# Patient Record
Sex: Female | Born: 1950 | Race: Black or African American | Hispanic: No | Smoking: Current every day smoker
Health system: Southern US, Community
[De-identification: ages and names within clinical notes are randomized; demographics above are authoritative.]

## PROBLEM LIST (undated history)

## (undated) DIAGNOSIS — O2230 Deep phlebothrombosis in pregnancy, unspecified trimester: Secondary | ICD-10-CM

## (undated) DIAGNOSIS — I10 Essential (primary) hypertension: Secondary | ICD-10-CM

## (undated) DIAGNOSIS — C419 Malignant neoplasm of bone and articular cartilage, unspecified: Secondary | ICD-10-CM

## (undated) DIAGNOSIS — E119 Type 2 diabetes mellitus without complications: Secondary | ICD-10-CM

## (undated) DIAGNOSIS — C801 Malignant (primary) neoplasm, unspecified: Secondary | ICD-10-CM

## (undated) DIAGNOSIS — E1140 Type 2 diabetes mellitus with diabetic neuropathy, unspecified: Secondary | ICD-10-CM

## (undated) DIAGNOSIS — I639 Cerebral infarction, unspecified: Secondary | ICD-10-CM

## (undated) HISTORY — PX: ABDOMINAL HYSTERECTOMY: SHX81

---

## 2004-03-21 ENCOUNTER — Other Ambulatory Visit: Payer: Self-pay

## 2004-11-22 ENCOUNTER — Emergency Department: Payer: Self-pay | Admitting: Emergency Medicine

## 2004-11-24 ENCOUNTER — Emergency Department: Payer: Self-pay | Admitting: Emergency Medicine

## 2005-03-24 ENCOUNTER — Ambulatory Visit: Payer: Self-pay

## 2006-04-13 ENCOUNTER — Emergency Department: Payer: Self-pay | Admitting: Emergency Medicine

## 2008-04-13 ENCOUNTER — Emergency Department: Payer: Self-pay | Admitting: Unknown Physician Specialty

## 2011-02-22 ENCOUNTER — Emergency Department: Payer: Self-pay | Admitting: Unknown Physician Specialty

## 2011-03-16 ENCOUNTER — Emergency Department: Payer: Self-pay | Admitting: Emergency Medicine

## 2011-05-10 ENCOUNTER — Emergency Department: Payer: Self-pay | Admitting: Unknown Physician Specialty

## 2011-06-09 ENCOUNTER — Emergency Department: Payer: Self-pay | Admitting: Internal Medicine

## 2011-09-29 ENCOUNTER — Emergency Department: Payer: Self-pay | Admitting: Emergency Medicine

## 2011-10-27 ENCOUNTER — Emergency Department: Payer: Self-pay | Admitting: Unknown Physician Specialty

## 2012-07-24 ENCOUNTER — Emergency Department: Payer: Self-pay | Admitting: Emergency Medicine

## 2012-07-24 LAB — URINALYSIS, COMPLETE
Bilirubin,UR: NEGATIVE
Glucose,UR: >=500 mg/dL (ref 0–75)
Ketone: NEGATIVE
Protein: NEGATIVE
RBC,UR: <1 /HPF (ref 0–5)
Squamous Epithelial: 4
WBC UR: 3 /HPF (ref 0–5)

## 2012-07-24 LAB — HEPATIC FUNCTION PANEL A (ARMC): Bilirubin, Direct: <0.1 mg/dL (ref 0.00–0.20)

## 2012-07-24 LAB — BASIC METABOLIC PANEL
Anion Gap: 7 (ref 7–16)
BUN: 17 mg/dL (ref 7–18)
Calcium, Total: 8.7 mg/dL (ref 8.5–10.1)
Chloride: 99 mmol/L (ref 98–107)
Co2: 27 mmol/L (ref 21–32)
Osmolality: 284 (ref 275–301)
Potassium: 4 mmol/L (ref 3.5–5.1)

## 2012-07-24 LAB — CBC
HCT: 31.1 % — ABNORMAL LOW (ref 35.0–47.0)
HGB: 10.3 g/dL — ABNORMAL LOW (ref 12.0–16.0)
Platelet: 177 10*3/uL (ref 150–440)
WBC: 5.6 10*3/uL (ref 3.6–11.0)

## 2012-07-24 LAB — LIPASE, BLOOD: Lipase: 126 U/L (ref 73–393)

## 2013-01-25 ENCOUNTER — Inpatient Hospital Stay: Payer: Self-pay | Admitting: Internal Medicine

## 2013-01-25 LAB — CBC WITH DIFFERENTIAL/PLATELET
Basophil #: 0.1 10*3/uL (ref 0.0–0.1)
Eosinophil #: 0 10*3/uL (ref 0.0–0.7)
HGB: 10.1 g/dL — ABNORMAL LOW (ref 12.0–16.0)
Lymphocyte #: 2.3 10*3/uL (ref 1.0–3.6)
Lymphocyte %: 31.3 %
MCH: 28.7 pg (ref 26.0–34.0)
MCHC: 32.9 g/dL (ref 32.0–36.0)
Monocyte #: 0.4 x10 3/mm (ref 0.2–0.9)
Monocyte %: 5.3 %
Neutrophil #: 4.6 10*3/uL (ref 1.4–6.5)
Neutrophil %: 62.4 %
Platelet: 149 10*3/uL — ABNORMAL LOW (ref 150–440)
RBC: 3.53 10*6/uL — ABNORMAL LOW (ref 3.80–5.20)

## 2013-01-25 LAB — URINALYSIS, COMPLETE
Bilirubin,UR: NEGATIVE
Blood: NEGATIVE
Glucose,UR: >=500 mg/dL (ref 0–75)
Hyaline Cast: 58
Leukocyte Esterase: NEGATIVE
Nitrite: NEGATIVE
Ph: 5 (ref 4.5–8.0)
Protein: 30
RBC,UR: 3 /HPF (ref 0–5)

## 2013-01-25 LAB — COMPREHENSIVE METABOLIC PANEL
Alkaline Phosphatase: 118 U/L (ref 50–136)
Anion Gap: 10 (ref 7–16)
Calcium, Total: 9.3 mg/dL (ref 8.5–10.1)
EGFR (African American): 20 — ABNORMAL LOW
Glucose: 502 mg/dL (ref 65–99)
Osmolality: 287 (ref 275–301)
Potassium: 4.1 mmol/L (ref 3.5–5.1)
SGOT(AST): 28 U/L (ref 15–37)
SGPT (ALT): 22 U/L (ref 12–78)
Total Protein: 7.7 g/dL (ref 6.4–8.2)

## 2013-01-25 LAB — TSH: Thyroid Stimulating Horm: 0.336 u[IU]/mL — ABNORMAL LOW

## 2013-01-25 LAB — HEMOGLOBIN A1C: Hemoglobin A1C: 11.1 % — ABNORMAL HIGH (ref 4.2–6.3)

## 2013-01-25 LAB — T4, FREE: Free Thyroxine: 1.09 ng/dL (ref 0.76–1.46)

## 2013-01-26 LAB — CBC WITH DIFFERENTIAL/PLATELET
Basophil #: 0 10*3/uL (ref 0.0–0.1)
Basophil %: 0.2 %
Eosinophil #: 0 10*3/uL (ref 0.0–0.7)
Eosinophil %: 0.4 %
HCT: 27.1 % — ABNORMAL LOW (ref 35.0–47.0)
HGB: 9 g/dL — ABNORMAL LOW (ref 12.0–16.0)
MCH: 28.8 pg (ref 26.0–34.0)
Monocyte #: 0.3 x10 3/mm (ref 0.2–0.9)
Monocyte %: 5.6 %
Neutrophil #: 3.2 10*3/uL (ref 1.4–6.5)
Neutrophil %: 50.4 %
RBC: 3.13 10*6/uL — ABNORMAL LOW (ref 3.80–5.20)

## 2013-01-26 LAB — MAGNESIUM: Magnesium: 1.9 mg/dL

## 2013-01-26 LAB — BASIC METABOLIC PANEL
BUN: 26 mg/dL — ABNORMAL HIGH (ref 7–18)
Calcium, Total: 8.5 mg/dL (ref 8.5–10.1)
Creatinine: 1.63 mg/dL — ABNORMAL HIGH (ref 0.60–1.30)
Glucose: 100 mg/dL — ABNORMAL HIGH (ref 65–99)
Osmolality: 284 (ref 275–301)

## 2013-01-26 LAB — LIPID PANEL
HDL Cholesterol: 29 mg/dL — ABNORMAL LOW (ref 40–60)
Ldl Cholesterol, Calc: 51 mg/dL (ref 0–100)

## 2013-01-26 LAB — URINE CULTURE

## 2013-07-16 ENCOUNTER — Emergency Department: Payer: Self-pay | Admitting: Emergency Medicine

## 2013-08-27 ENCOUNTER — Emergency Department: Payer: Self-pay | Admitting: Emergency Medicine

## 2013-08-27 LAB — CBC WITH DIFFERENTIAL/PLATELET
Basophil #: 0 10*3/uL (ref 0.0–0.1)
HGB: 10.3 g/dL — ABNORMAL LOW (ref 12.0–16.0)
Lymphocyte #: 2.1 10*3/uL (ref 1.0–3.6)
Lymphocyte %: 20.5 %
MCH: 29.6 pg (ref 26.0–34.0)
MCHC: 33 g/dL (ref 32.0–36.0)
MCV: 90 fL (ref 80–100)
Monocyte #: 0.7 x10 3/mm (ref 0.2–0.9)
Monocyte %: 6.5 %
Neutrophil #: 7.6 10*3/uL — ABNORMAL HIGH (ref 1.4–6.5)
Neutrophil %: 72.3 %
Platelet: 290 10*3/uL (ref 150–440)
RBC: 3.47 10*6/uL — ABNORMAL LOW (ref 3.80–5.20)
RDW: 12.1 % (ref 11.5–14.5)
WBC: 10.4 10*3/uL (ref 3.6–11.0)

## 2013-08-27 LAB — COMPREHENSIVE METABOLIC PANEL
Albumin: 3.4 g/dL (ref 3.4–5.0)
Anion Gap: 6 — ABNORMAL LOW (ref 7–16)
BUN: 15 mg/dL (ref 7–18)
Bilirubin,Total: 0.3 mg/dL (ref 0.2–1.0)
Co2: 28 mmol/L (ref 21–32)
Creatinine: 1.22 mg/dL (ref 0.60–1.30)
EGFR (African American): 55 — ABNORMAL LOW
Glucose: 357 mg/dL — ABNORMAL HIGH (ref 65–99)
Osmolality: 278 (ref 275–301)
Potassium: 4.2 mmol/L (ref 3.5–5.1)
Sodium: 131 mmol/L — ABNORMAL LOW (ref 136–145)
Total Protein: 8.5 g/dL — ABNORMAL HIGH (ref 6.4–8.2)

## 2014-08-01 ENCOUNTER — Emergency Department: Payer: Self-pay | Admitting: Emergency Medicine

## 2014-08-01 LAB — CBC WITH DIFFERENTIAL/PLATELET
Basophil #: 0 10*3/uL (ref 0.0–0.1)
Basophil %: 0.5 %
EOS PCT: 0.5 %
Eosinophil #: 0 10*3/uL (ref 0.0–0.7)
HCT: 32.7 % — AB (ref 35.0–47.0)
HGB: 10.5 g/dL — AB (ref 12.0–16.0)
Lymphocyte #: 1.8 10*3/uL (ref 1.0–3.6)
Lymphocyte %: 35.8 %
MCH: 29.2 pg (ref 26.0–34.0)
MCHC: 32.1 g/dL (ref 32.0–36.0)
MCV: 91 fL (ref 80–100)
Monocyte #: 0.3 x10 3/mm (ref 0.2–0.9)
Monocyte %: 5 %
Neutrophil #: 2.9 10*3/uL (ref 1.4–6.5)
Neutrophil %: 58.2 %
Platelet: 146 10*3/uL — ABNORMAL LOW (ref 150–440)
RBC: 3.59 10*6/uL — AB (ref 3.80–5.20)
RDW: 12 % (ref 11.5–14.5)
WBC: 5 10*3/uL (ref 3.6–11.0)

## 2014-08-01 LAB — BASIC METABOLIC PANEL
ANION GAP: 7 (ref 7–16)
BUN: 30 mg/dL — AB (ref 7–18)
Calcium, Total: 8.9 mg/dL (ref 8.5–10.1)
Chloride: 97 mmol/L — ABNORMAL LOW (ref 98–107)
Co2: 29 mmol/L (ref 21–32)
Creatinine: 1.65 mg/dL — ABNORMAL HIGH (ref 0.60–1.30)
EGFR (African American): 40 — ABNORMAL LOW
EGFR (Non-African Amer.): 33 — ABNORMAL LOW
GLUCOSE: 356 mg/dL — AB (ref 65–99)
Osmolality: 287 (ref 275–301)
Potassium: 3.9 mmol/L (ref 3.5–5.1)
SODIUM: 133 mmol/L — AB (ref 136–145)

## 2014-08-01 LAB — TROPONIN I: Troponin-I: <0.02 ng/mL

## 2014-12-25 NOTE — Discharge Summary (Signed)
PATIENT NAME:  Tasha Howard, Tasha Howard MR#:  [id number] DATE OF BIRTH:  08/27/1951  DATE OF ADMISSION:  01/25/2013 DATE OF DISCHARGE:  01/26/2013  FINAL DIAGNOSES:  1.  Uncontrolled diabetes.  2.  Hypoglycemia.  3.  Urinary tract infection.  4.  Acute renal failure.  5.  Hypokalemia.  6.  Gastritis.  7.  Diabetic neuropathy.   CONDITION ON DISCHARGE: Stable.   CODE STATUS: FULL CODE.   DISCHARGE MEDICATIONS: 1.  Amitriptyline 10 mg oral tablet once a day.  2.  Lisinopril 10 mg oral tablet once a day.  3.  Percocet 7.5/325 mg oral tablet 2 times a day as needed for pain.  4.  Gabapentin 300 mg oral capsule, 600 mg once a day and 900 mg at bedtime. 5.  Pravastatin 10 mg oral tablet once a day.  6.  Insulin glargine 24 units subcutaneous once a day.  7.  Ceftin 500 mg oral tablet 2 times a day for 4 days.   HOME OXYGEN: No.   DIET ON DISCHARGE: Low sodium, carbohydrate-controlled ADA diet.   ACTIVITY LIMITATION: As tolerated.   TIMEFRAME TO FOLLOW-UP:  Within 1 to 2 weeks. Advised to keep kidney function checked with PMD in the next 64 to 2 weeks.   HISTORY OF PRESENT ILLNESS: A 64 year old African American female with hypertension, diabetes, diabetic neuropathy presented with feeling unwell for about a week. Generalized weakness, loss of appetite, nausea and vomiting for the past 3 days. She stated that she did not have any food and she was still taking medications.  She took Lantus the previous night, felt like she could not keep her food down. She denied any fever, but had about 10 pound weight loss in the last 1 week. While in the ER, she was noted to have renal failure. Creatinine was 2.82, elevated blood glucose of 500 range and hemoglobin A1c was 11.11. So she was admitted for further management of hypoglycemia   Zeba:  1.  The patient came with nausea, abdominal pain, vomiting and renal failure, dehydration and hypoglycemia. She was given dose of Lantus and  with IV fluids and gluco checks.  Her blood sugar level came down and remained under control the next day.   2.  She was found having UTI and was started on Rocephin.  Culture returned contamination, but we discharged her with antibiotics.  3.  Nausea and vomiting.  Likely is presentation of UTI or viral gastroenteritis, but resolved with better control of glucose and antibiotic for just 1 day. The patient tolerated diet very well.  4.  Acute renal failure due to dehydration and hypoglycemia, which was resolving having significant and appropriate response to IV fluid the next day and so we advised her to go home and follow renal function with her primary care physician to make sure complete resolution.  5.  Hypokalemia. We replaced orally.  6.  Diabetic neuropathy. Continued Gabapentin. 7.  Smoking cessation counseling was done for 5 minutes.   IMPORTANT LABORATORY AND DIAGNOSTIC RESULTS:  WBC count 7.4, hemoglobin 10.1, platelet count 149, creatinine 2.82, sodium 128, potassium 4.1, TSH 0.336,  Urinalysis is positive with 28 WBCs. Urine culture: Mixed organisms. Kidney sonogram:  No hydronephrosis. Creatinine level came down to 1.63 the next day.  Blood glucose was 100. Triglyceride was 168, total cholesterol was 114 and HDL was 29, LDL was 61.   TOTAL TIME SPENT ON THIS DISCHARGE: 45 minutes.  ____________________________ Ceasar Lund Anselm Jungling, MD vgv:sb D:  01/29/2013 22:59:09 ET T: 01/30/2013 10:17:02 ET JOB#: [id number]  cc: Ceasar Lund. Anselm Jungling, MD, <Dictator> Vaughan Basta MD ELECTRONICALLY SIGNED 01/31/2013 14:02

## 2014-12-25 NOTE — H&P (Signed)
PATIENT NAME:  Tasha Howard, Tasha Howard MR#:  [id number] DATE OF BIRTH:  1950/11/15  DATE OF ADMISSION:  01/25/2013  REFERRING PHYSICIAN:  Dr. Corky Downs   PRIMARY CARE PHYSICIAN:  [facility]   CHIEF COMPLAINT:  Not eating well, lethargic, weak.    HISTORY OF PRESENT ILLNESS: The patient is a pleasant, 64 year old African-American female with hypertension, diabetes, diabetic neuropathy, who has been feeling unwell for about a week, with generalized weakness, loss of appetite, nausea and vomiting for the past 3 days once daily. She also states that she had no food at home, although she is taking her medications. She took Lantus last night. She feels like she could not keep her food down yesterday, but feels like she could perhaps take it today. She has been taking her ACE inhibitor for her hypertension in the meantime. She denies having any fevers, but states she has had about a 10-pounds weight loss in the last week. While in the ER, she was noted to have acute renal failure, with creatinine of 2.82, elevation of blood glucose in the 500 range, and elevation of hemoglobin A1c of 11.1. Hospitalist services were contacted for further evaluation and management.   PAST MEDICAL HISTORY:  Diabetes, hypertension, depression, diabetic neuropathy.   PAST SURGICAL HISTORY:  Hysterectomy and goiter removal.   FAMILY HISTORY:  Mom with diabetes, hypertension and cancer.  ALLERGIES:  CODEINE, causing itching.   SOCIAL HISTORY:  Smokes 1/2 pack a day for about 23 years. Occasional alcohol. No drug use. Is on disability.   OUTPATIENT MEDICATIONS:  Gabapentin 600 mg at night, per patient, Lantus 24 units at bedtime, lisinopril 10 mg daily, amitriptyline 25 mg at bedtime, Cymbalta possibly 10 mg at bedtime, but none of these are confirmed at this point.    REVIEW OF SYSTEMS:  CONSTITUTIONAL:  No fever. Positive fatigue, weakness, and 10-pound weight loss in the past week, per patient.  EYES:  Had some blurry  vision earlier today. No double vision. No redness.  EARS, NOSE, THROAT:  No tinnitus or hearing loss. Some sinus pain on the left prior, not now.  CARDIOVASCULAR:  No chest pains or palpitations. No swelling in the legs or arrhythmia.  RESPIRATORY: Had a dry cough, without wheezing, hemoptysis, shortness of breath or dyspnea on exertion.  GENITOURINARY: Denies dysuria, hematuria, incontinence, or decrease or increase in frequency.  GASTROINTESTINAL: Positive for nausea and vomiting as above. No abdominal pain. No hematemesis, dark stools or ulcers.  ENDOCRINE:  Denies polyuria, nocturia. HEMATOLOGIC/LYMPHATIC:  Denies anemia or easy bruising.  SKIN:  No rashes.  MUSCULOSKELETAL:  Denies arthritis.  NEUROLOGIC:  Denies focal weakness, numbness. Has diabetic neuropathy.  PSYCHIATRIC:  Has depression.   PHYSICAL EXAMINATION: VITAL SIGNS: Temperature on arrival was 99.2, pulse rate 109, respiratory rate 20, blood pressure 101/60, O2 sat 96% on room air.  GENERAL: The patient is a well-developed, African-American female lying in bed in no obvious distress.  HEENT: Normocephalic, atraumatic. Pupils are equal and reactive. Anicteric sclerae. Extraocular muscles intact. Dry mucous membranes.  NECK:  Supple. No thyroid tenderness. No cervical lymphadenopathy.  CARDIOVASCULAR:  S1, S2, tachycardic. No murmurs, rubs or gallops.  LUNGS:  Clear to auscultation, without wheezing, rhonchi or rales.  ABDOMEN:  Soft. Mild tenderness to palpation in the left lower quadrant, without rebound or guarding. Normoactive bowel sounds in all quadrants.  EXTREMITIES:  No significant lower extremity edema.  NEUROLOGIC:  Cranial nerves II to XII grossly intact. Strength is 5/5 in all extremities. Sensation  intact to light touch.  PSYCHIATRIC:  Awake, alert, oriented x 3. Cooperative, pleasant, conversant.  SKIN:  No obvious rashes.   LABS:  Glucose on arrival 502, BUN 33, creatinine 2.82,  Sodium 128, chloride 92.  Hemoglobin A1c 11.1. LFTs within normal limits. TSH is 0.33 and free thyroxine is 1.09. WBCs 7.9, hemoglobin 10.1, platelets 149. UA:  No nitrites or leukocyte esterase, 3 RBCs, 28 WBCs, 1+ bacteria. EKG:  Normal sinus rhythm, rate is 95, no acute ST elevations or depressions.   ASSESSMENT AND PLAN: We have a pleasant, 64 year-old female with hypertension, diabetes, diabetic neuropathy, who has been feeling unwell, with possible gastritis, with symptoms ongoing for the past week, with decreased p.o. intake. 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 This is likely secondary to dehydration, poor p.o. intake, and taking lisinopril. Would hold the lisinopril, start the patient on aggressive IV fluid resuscitation, and check a renal ultrasound to look for hydronephrosis, Although patient has a positive UA, she is not having any dysuria or symptomatic signs at this point. However, given the renal failure, urine cultures, I have ordered and will give her one-time ceftriaxone. I have also ordered a urine creatinine and urine sodium to calculate fractional excretion of sodium to look for volume state, but I think she is on the dehydrated side. If the creatinine gets worse, would consider a Nephrology consult. I would continue the Lantus, start her on sliding scale insulin. Her blood pressure is acceptable at this point, although on the lower side, and I would hold the ACE inhibitor. The patient does have hyponatremia, but I think that is in the setting of hyperglycemia, and corrected for that it will be in the low 130s, which is not too far from her last sodium. Her hemoglobin A1c is 11, dictating poor compliance as an outpatient. Although TSH is on the lower side, I would rather do full thyroid function test in 4 to 6 weeks, once her acute illness is resolved. Will start on heparin for deep vein thrombosis prophylaxis. The patient is FULL CODE. For  tobacco abuse, she was counseled for 3 minutes. I want to start her on a patch. I do not think she is wanting to quit at this point. This could be addressed later as well.   Total time spent is 65 minutes.     ____________________________ Vivien Presto, MD sa:mr D: 01/25/2013 18:49:11 ET T: 01/25/2013 20:20:29 ET JOB#: [id number]  cc: Vivien Presto, MD, <Dictator> Vivien Presto MD ELECTRONICALLY SIGNED 02/04/2013 20:03

## 2018-10-19 ENCOUNTER — Encounter: Payer: Self-pay | Admitting: Emergency Medicine

## 2018-10-19 ENCOUNTER — Emergency Department: Payer: Medicare HMO

## 2018-10-19 ENCOUNTER — Other Ambulatory Visit: Payer: Self-pay

## 2018-10-19 ENCOUNTER — Emergency Department
Admission: EM | Admit: 2018-10-19 | Discharge: 2018-10-19 | Disposition: A | Discharge status: Other Acute Inpatient Hospital | Payer: Medicare HMO | Attending: Emergency Medicine | Admitting: Emergency Medicine

## 2018-10-19 DIAGNOSIS — I639 Cerebral infarction, unspecified: Secondary | ICD-10-CM | POA: Insufficient documentation

## 2018-10-19 DIAGNOSIS — I10 Essential (primary) hypertension: Secondary | ICD-10-CM | POA: Diagnosis not present

## 2018-10-19 DIAGNOSIS — I629 Nontraumatic intracranial hemorrhage, unspecified: Secondary | ICD-10-CM | POA: Diagnosis not present

## 2018-10-19 DIAGNOSIS — Z794 Long term (current) use of insulin: Secondary | ICD-10-CM | POA: Diagnosis not present

## 2018-10-19 DIAGNOSIS — Z859 Personal history of malignant neoplasm, unspecified: Secondary | ICD-10-CM | POA: Insufficient documentation

## 2018-10-19 DIAGNOSIS — Z8673 Personal history of transient ischemic attack (TIA), and cerebral infarction without residual deficits: Secondary | ICD-10-CM | POA: Diagnosis not present

## 2018-10-19 DIAGNOSIS — J09X9 Influenza due to identified novel influenza A virus with other manifestations: Secondary | ICD-10-CM | POA: Insufficient documentation

## 2018-10-19 DIAGNOSIS — Z7982 Long term (current) use of aspirin: Secondary | ICD-10-CM | POA: Insufficient documentation

## 2018-10-19 DIAGNOSIS — N390 Urinary tract infection, site not specified: Secondary | ICD-10-CM | POA: Diagnosis not present

## 2018-10-19 DIAGNOSIS — Z7901 Long term (current) use of anticoagulants: Secondary | ICD-10-CM | POA: Insufficient documentation

## 2018-10-19 DIAGNOSIS — Z79899 Other long term (current) drug therapy: Secondary | ICD-10-CM | POA: Insufficient documentation

## 2018-10-19 DIAGNOSIS — Z87891 Personal history of nicotine dependence: Secondary | ICD-10-CM | POA: Insufficient documentation

## 2018-10-19 DIAGNOSIS — J101 Influenza due to other identified influenza virus with other respiratory manifestations: Secondary | ICD-10-CM

## 2018-10-19 DIAGNOSIS — R531 Weakness: Secondary | ICD-10-CM | POA: Diagnosis present

## 2018-10-19 HISTORY — DX: Essential (primary) hypertension: I10

## 2018-10-19 HISTORY — DX: Cerebral infarction, unspecified: I63.9

## 2018-10-19 HISTORY — DX: Malignant (primary) neoplasm, unspecified: C80.1

## 2018-10-19 LAB — COMPREHENSIVE METABOLIC PANEL
ALK PHOS: 258 U/L — AB (ref 38–126)
ALT: 132 U/L — AB (ref 0–44)
AST: 95 U/L — ABNORMAL HIGH (ref 15–41)
Albumin: 3.2 g/dL — ABNORMAL LOW (ref 3.5–5.0)
Anion gap: 5 (ref 5–15)
BILIRUBIN TOTAL: 0.6 mg/dL (ref 0.3–1.2)
BUN: 22 mg/dL (ref 8–23)
CALCIUM: 8.3 mg/dL — AB (ref 8.9–10.3)
CO2: 31 mmol/L (ref 22–32)
Chloride: 106 mmol/L (ref 98–111)
Creatinine, Ser: 1.01 mg/dL — ABNORMAL HIGH (ref 0.44–1.00)
GFR calc Af Amer: >60 mL/min (ref >60)
GFR calc non Af Amer: 58 mL/min — ABNORMAL LOW (ref >60)
Glucose, Bld: 94 mg/dL (ref 70–99)
Potassium: 4.5 mmol/L (ref 3.5–5.1)
Sodium: 142 mmol/L (ref 135–145)
TOTAL PROTEIN: 6 g/dL — AB (ref 6.5–8.1)

## 2018-10-19 LAB — INFLUENZA PANEL BY PCR (TYPE A & B)
Influenza A By PCR: POSITIVE — AB
Influenza B By PCR: NEGATIVE

## 2018-10-19 LAB — CBC WITH DIFFERENTIAL/PLATELET
ABS IMMATURE GRANULOCYTES: 0.03 10*3/uL (ref 0.00–0.07)
BASOS PCT: 1 %
Basophils Absolute: 0 10*3/uL (ref 0.0–0.1)
Eosinophils Absolute: 0.1 10*3/uL (ref 0.0–0.5)
Eosinophils Relative: 2 %
HCT: 29.6 % — ABNORMAL LOW (ref 36.0–46.0)
Hemoglobin: 9.1 g/dL — ABNORMAL LOW (ref 12.0–15.0)
Immature Granulocytes: 1 %
Lymphocytes Relative: 40 %
Lymphs Abs: 2.4 10*3/uL (ref 0.7–4.0)
MCH: 30.5 pg (ref 26.0–34.0)
MCHC: 30.7 g/dL (ref 30.0–36.0)
MCV: 99.3 fL (ref 80.0–100.0)
MONO ABS: 0.4 10*3/uL (ref 0.1–1.0)
Monocytes Relative: 7 %
NEUTROS ABS: 3 10*3/uL (ref 1.7–7.7)
NEUTROS PCT: 49 %
Platelets: 186 10*3/uL (ref 150–400)
RBC: 2.98 MIL/uL — AB (ref 3.87–5.11)
RDW: 16.3 % — ABNORMAL HIGH (ref 11.5–15.5)
WBC: 6 10*3/uL (ref 4.0–10.5)
nRBC: 0 % (ref 0.0–0.2)

## 2018-10-19 LAB — URINALYSIS, COMPLETE (UACMP) WITH MICROSCOPIC
Bilirubin Urine: NEGATIVE
Glucose, UA: NEGATIVE mg/dL
KETONES UR: NEGATIVE mg/dL
Nitrite: POSITIVE — AB
Protein, ur: 30 mg/dL — AB
Specific Gravity, Urine: 1.01 (ref 1.005–1.030)
pH: 7 (ref 5.0–8.0)

## 2018-10-19 MED ORDER — SODIUM CHLORIDE 0.9 % IV BOLUS
1000.0000 mL | Freq: Once | INTRAVENOUS | Status: AC
  Administered 2018-10-19: 1000 mL via INTRAVENOUS

## 2018-10-19 MED ORDER — SODIUM CHLORIDE 0.9 % IV SOLN
1.0000 g | Freq: Once | INTRAVENOUS | Status: AC
  Administered 2018-10-19: 1 g via INTRAVENOUS
  Filled 2018-10-19: qty 10

## 2018-10-19 MED ORDER — EMPTY CONTAINERS FLEXIBLE MISC
900.0000 mg | Freq: Once | Status: AC
  Administered 2018-10-19: 900 mg via INTRAVENOUS
  Filled 2018-10-19: qty 90

## 2018-10-19 MED ORDER — OXYCODONE-ACETAMINOPHEN 5-325 MG PO TABS
1.0000 | ORAL_TABLET | Freq: Once | ORAL | Status: AC
  Administered 2018-10-19: 1 via ORAL
  Filled 2018-10-19: qty 1

## 2018-10-19 MED ORDER — LABETALOL HCL 5 MG/ML IV SOLN
5.0000 mg | Freq: Once | INTRAVENOUS | Status: AC
  Administered 2018-10-19: 5 mg via INTRAVENOUS
  Filled 2018-10-19: qty 4

## 2018-10-19 NOTE — ED Provider Notes (Signed)
[facility] Emergency Department Provider Note ____________________________________________   First MD Initiated Contact with Patient 10/19/18 1027     (approximate)  I have reviewed the triage vital signs and the nursing notes.   HISTORY  Chief Complaint Weakness    HPI Tasha Howard is a 68 y.o. female with PMH as noted below including history of recent stroke last month with residual left-sided weakness who presents for possible worsening weakness.  The patient's daughter told EMS that she had increased weakness on the left side with unknown onset.  The patient denies any increasing weakness or other acute symptoms.  Past Medical History:  Diagnosis Date  . Cancer ([place])   . Hypertension   . Stroke [facility])     There are no active problems to display for this patient.   Past Surgical History:  Procedure Laterality Date  . ABDOMINAL HYSTERECTOMY      Prior to Admission medications   Medication Sig Start Date End Date Taking? Authorizing Provider  acetaminophen (TYLENOL) 325 MG tablet Take 650 mg by mouth every 6 (six) hours as needed for pain. 09/25/18  Yes [provider]  albuterol (PROVENTIL HFA;VENTOLIN HFA) 108 (90 Base) MCG/ACT inhaler Inhale 2 puffs into the lungs every 6 (six) hours as needed for wheezing. 12/28/15  Yes [provider]  apixaban (ELIQUIS) 2.5 MG TABS tablet Take 2.5 mg by mouth 2 (two) times daily. 09/25/18  Yes [provider]  aspirin 81 MG chewable tablet Chew 81 mg by mouth daily. 09/26/18 10/26/18 Yes [provider]  atorvastatin (LIPITOR) 80 MG tablet Take 80 mg by mouth every evening. 09/25/18 10/25/18 Yes [provider]  butalbital-acetaminophen-caffeine (FIORICET, ESGIC) 50-325-40 MG tablet Take 1 tablet by mouth every 6 (six) hours as needed for headache. 09/25/18  Yes [provider]  cetirizine (ZYRTEC) 10 MG tablet Take 10 mg by mouth daily. 03/28/17  Yes  [provider]  diclofenac sodium (VOLTAREN) 1 % GEL Apply 2 g topically 4 (four) times daily. 02/08/18 02/08/19 Yes [provider]  DULoxetine (CYMBALTA) 60 MG capsule Take 60 mg by mouth daily. 09/26/18 10/26/18 Yes [provider]  furosemide (LASIX) 20 MG tablet Take 20 mg by mouth daily. 04/17/18  Yes [provider]  insulin glargine (LANTUS) 100 UNIT/ML injection Inject 6 Units into the skin Nightly. 09/25/18 10/25/18 Yes [provider]  insulin lispro (HUMALOG) 100 UNIT/ML injection Inject 5 Units into the skin 3 (three) times daily. 09/25/18 10/25/18 Yes [provider]  losartan (COZAAR) 25 MG tablet Take 12.5 mg by mouth daily. 09/26/18 10/26/18 Yes [provider]  Melatonin 3 MG TABS Take 6 mg by mouth every evening. 09/25/18  Yes [provider]  metoprolol tartrate (LOPRESSOR) 25 MG tablet Take 12.5 mg by mouth 2 (two) times daily. 09/25/18 10/25/18 Yes [provider]  mirtazapine (REMERON) 15 MG tablet Take 15 mg by mouth Nightly. 07/30/18  Yes [provider]  Multiple Minerals (MULTI-MINERALS PO) Take 1 tablet by mouth daily. 09/26/18  Yes [provider]  nitroGLYCERIN (NITROLINGUAL) 0.4 MG/SPRAY spray Place 0.4 mg under the tongue as directed. 07/12/18 07/12/19 Yes [provider]  oxyCODONE-acetaminophen (PERCOCET) 7.5-325 MG tablet Take 1 tablet by mouth every 8 (eight) hours as needed for pain. 08/30/18  Yes [provider]  polyethylene glycol (MIRALAX / GLYCOLAX) packet Take 17 g by mouth daily. 09/25/18 10/25/18 Yes [provider]  pomalidomide (POMALYST) 4 MG capsule Take 4 mg by mouth  daily. 08/26/18  Yes [provider]  pregabalin (LYRICA) 75 MG capsule Take 75 mg by mouth 2 (two) times daily. 08/30/18 11/28/18 Yes [provider]  Sennosides (SENNA) 8.8 MG/5ML SYRP Take 2.5 mLs by mouth Nightly. 09/25/18 10/25/18 Yes [provider]    thiamine 100 MG tablet Take 200 mg by mouth 2 (two) times daily. 09/25/18 09/25/19 Yes [provider]  valACYclovir (VALTREX) 500 MG tablet Take 500 mg by mouth daily. 06/03/18  Yes [provider]  vitamin C (ASCORBIC ACID) 500 MG tablet Take 500 mg by mouth 2 (two) times daily.    [provider]  zinc sulfate 220 (50 Zn) MG capsule Take 220 mg by mouth daily.    [provider]    Allergies Metformin and related  No family history on file.  Social History Social History   Tobacco Use  . Smoking status: Former Research scientist (life sciences)  . Smokeless tobacco: Never Used  Substance Use Topics  . Alcohol use: Not Currently  . Drug use: Not Currently    Review of Systems  Constitutional: No fever. Eyes: No visual changes. ENT: No neck pain. Cardiovascular: Denies chest pain. Respiratory: Denies shortness of breath. Gastrointestinal: No nausea or vomiting.  No diarrhea.  Genitourinary: Negative for dysuria.  Musculoskeletal: Negative for back pain. Skin: Negative for rash. Neurological: Negative for headache.   ____________________________________________   PHYSICAL EXAM:  VITAL SIGNS: ED Triage Vitals  Enc Vitals Group     BP 10/19/18 0955 (!) 161/91     Pulse Rate 10/19/18 0955 94     Resp 10/19/18 0955 18     Temp 10/19/18 0955 99.2 F (37.3 C)     Temp Source 10/19/18 0955 Oral     SpO2 10/19/18 0955 91 %     Weight 10/19/18 0958 118 lb (53.5 kg)     Height 10/19/18 0958 5\' 6"  (1.676 m)     Head Circumference --      Peak Flow --      Pain Score 10/19/18 0956 0     Pain Loc --      Pain Edu? --      Excl. in [place]? --     Constitutional: Alert and oriented.  Generally weak appearing but in no acute distress. Eyes: Conjunctivae are normal.  EOMI.  PERRLA. Head: Atraumatic. Nose: No congestion/rhinnorhea. Mouth/Throat: Mucous membranes are dry.   Neck: Normal range of motion.  Cardiovascular: Normal rate, regular rhythm. Grossly normal  heart sounds.  Good peripheral circulation. Respiratory: Normal respiratory effort.  No retractions. Lungs CTAB. Gastrointestinal: Soft and nontender. No distention.  Genitourinary: No flank tenderness. Musculoskeletal: No lower extremity edema.  Extremities warm and well perfused.  Neurologic:  Normal speech and language.   Left sided weakness which patient states is not changed.  Otherwise no focal weakness or numbness.  No gross focal neurologic deficits are appreciated.  Skin:  Skin is warm and dry. No rash noted. Psychiatric: Mood and affect are normal. Speech and behavior are normal.  ____________________________________________   LABS (all labs ordered are listed, but only abnormal results are displayed)  Labs Reviewed  CBC WITH DIFFERENTIAL/PLATELET - Abnormal; Notable for the following components:      Result Value   RBC 2.98 (*)    Hemoglobin 9.1 (*)    HCT 29.6 (*)    RDW 16.3 (*)    All other components within normal limits  COMPREHENSIVE METABOLIC PANEL - Abnormal; Notable for the following components:  Creatinine, Ser 1.01 (*)    Calcium 8.3 (*)    Total Protein 6.0 (*)    Albumin 3.2 (*)    AST 95 (*)    ALT 132 (*)    Alkaline Phosphatase 258 (*)    GFR calc non Af Amer 58 (*)    All other components within normal limits  INFLUENZA PANEL BY PCR (TYPE A & B) - Abnormal; Notable for the following components:   Influenza A By PCR POSITIVE (*)    All other components within normal limits  URINALYSIS, COMPLETE (UACMP) WITH MICROSCOPIC - Abnormal; Notable for the following components:   Color, Urine YELLOW (*)    APPearance HAZY (*)    Hgb urine dipstick SMALL (*)    Protein, ur 30 (*)    Nitrite POSITIVE (*)    Leukocytes,Ua TRACE (*)    Bacteria, UA FEW (*)    All other components within normal limits   ____________________________________________  EKG  ED ECG REPORT I, Arta Silence, the attending physician, personally viewed and interpreted this  ECG.  Date: 10/19/2018 EKG Time: 1159 Rate: 97 Rhythm: normal sinus rhythm QRS Axis: normal Intervals: normal ST/T Wave abnormalities: normal Narrative Interpretation: no evidence of acute ischemia  ____________________________________________  RADIOLOGY  CT head: Acute right posterior frontal lobe infarct with rim of hemorrhage along the periphery  ____________________________________________   PROCEDURES  Procedure(s) performed: No  Procedures  Critical Care performed: Yes  CRITICAL CARE Performed by: Arta Silence   Total critical care time: 45 minutes  Critical care time was exclusive of separately billable procedures and treating other patients.  Critical care was necessary to treat or prevent imminent or life-threatening deterioration.  Critical care was time spent personally by me on the following activities: development of treatment plan with patient and/or surrogate as well as nursing, discussions with consultants, evaluation of patient's response to treatment, examination of patient, obtaining history from patient or surrogate, ordering and performing treatments and interventions, ordering and review of laboratory studies, ordering and review of radiographic studies, pulse oximetry and re-evaluation of patient's condition.  ____________________________________________   INITIAL IMPRESSION / ASSESSMENT AND PLAN / ED COURSE  Pertinent labs & imaging results that were available during my care of the patient were reviewed by me and considered in my medical decision making (see chart for details).  68 year old female with PMH as noted above including parent history of recent stroke presents with apparent worsening left-sided weakness per the family members although the patient denies this.  Review of systems is otherwise negative.  I reviewed the past medical records in [place].  I do not see any record of a recent admission or recent  stroke diagnosis within the last few months.  On exam, the patient is somewhat weak appearing but in no acute distress.  She has left-sided weakness but no other focal neuro findings.  The remainder of the exam is as described above.  She does have dry mucous membranes and appears somewhat dehydrated.  Overall I have low suspicion for acute stroke although given the patient's baseline deficit, and acute on chronic stroke is certainly possible.  Differential also includes dehydration, other metabolic etiology, or infectious cause.  We will obtain CT head, lab work-up, give fluids, and reassess.  ----------------------------------------- 1:19 PM on 10/19/2018 -----------------------------------------  CT shows possible acute on chronic right sided infarct with rim of hemorrhage.  At this time there is no midline shift or mass-effect.  The patient's mental status and  neurologic exam have not changed since her ED arrival.  The patient's blood pressure is elevated so I will treat with labetalol.  The patient is also on a low-dose of Eliquis.  She states that she last took her daily medications last night.  Since she has potentially life-threatening hemorrhage, I have ordered andexxa.   Work-up also reveals influenza A and findings consistent with a UTI.  The patient will need transfer to facility with full neurosurgical services.  I contacted the [facility] transfer center based on patient's preference and prior admissions there.  I discussed the case with Dr. Guerry Bruin from neurology and with Dr. Evelena Leyden from the emergency department, who has accepted the patient.  The patient is stable for transfer at this time. ____________________________________________   FINAL CLINICAL IMPRESSION(S) / ED DIAGNOSES  Final diagnoses:  Ischemic stroke ([place])  Intracranial hemorrhage ([place])  Influenza A  Urinary tract infection without hematuria, site unspecified      NEW MEDICATIONS STARTED DURING THIS  VISIT:  Discharge Medication List as of 10/19/2018  2:34 PM       Note:  This document was prepared using Dragon voice recognition software and may include unintentional dictation errors.    Arta Silence, MD 10/19/18 [phone]

## 2018-10-19 NOTE — ED Triage Notes (Addendum)
Pt arrives via ems from home with concerns over increased left sided weakness. Pt had a stroke in January causing pt to become bed bound. Pt's family reports to ems that this morning pt had increased weakness of her left side. Unknown time of last known well. Pt arrives alert and denies any pain. Pt denies feeling more weak. In triage pt states "I think it's just taking a toll of my daughter and she don't want to take care of me anymore." CBG 125

## 2018-10-19 NOTE — ED Notes (Signed)
Second IV access attempted by this RN without success. Transport team agreed to attempt.

## 2018-10-19 NOTE — ED Notes (Signed)
emtala reviewed by this RN 

## 2018-10-30 ENCOUNTER — Inpatient Hospital Stay
Admission: EM | Admit: 2018-10-30 | Discharge: 2018-11-02 | DRG: 871 | Disposition: A | Discharge status: Home Health | Payer: Medicare HMO | Attending: Family Medicine | Admitting: Family Medicine

## 2018-10-30 ENCOUNTER — Other Ambulatory Visit: Payer: Self-pay

## 2018-10-30 ENCOUNTER — Emergency Department: Payer: Medicare HMO

## 2018-10-30 DIAGNOSIS — I69354 Hemiplegia and hemiparesis following cerebral infarction affecting left non-dominant side: Secondary | ICD-10-CM

## 2018-10-30 DIAGNOSIS — Z9221 Personal history of antineoplastic chemotherapy: Secondary | ICD-10-CM

## 2018-10-30 DIAGNOSIS — Z8249 Family history of ischemic heart disease and other diseases of the circulatory system: Secondary | ICD-10-CM | POA: Diagnosis not present

## 2018-10-30 DIAGNOSIS — Z681 Body mass index (BMI) 19 or less, adult: Secondary | ICD-10-CM

## 2018-10-30 DIAGNOSIS — I110 Hypertensive heart disease with heart failure: Secondary | ICD-10-CM | POA: Diagnosis present

## 2018-10-30 DIAGNOSIS — R197 Diarrhea, unspecified: Secondary | ICD-10-CM | POA: Diagnosis not present

## 2018-10-30 DIAGNOSIS — I5042 Chronic combined systolic (congestive) and diastolic (congestive) heart failure: Secondary | ICD-10-CM | POA: Diagnosis present

## 2018-10-30 DIAGNOSIS — Z86718 Personal history of other venous thrombosis and embolism: Secondary | ICD-10-CM | POA: Diagnosis not present

## 2018-10-30 DIAGNOSIS — J180 Bronchopneumonia, unspecified organism: Secondary | ICD-10-CM | POA: Diagnosis present

## 2018-10-30 DIAGNOSIS — G8194 Hemiplegia, unspecified affecting left nondominant side: Secondary | ICD-10-CM | POA: Diagnosis not present

## 2018-10-30 DIAGNOSIS — J9601 Acute respiratory failure with hypoxia: Secondary | ICD-10-CM | POA: Diagnosis present

## 2018-10-30 DIAGNOSIS — A419 Sepsis, unspecified organism: Principal | ICD-10-CM | POA: Diagnosis present

## 2018-10-30 DIAGNOSIS — Z9481 Bone marrow transplant status: Secondary | ICD-10-CM

## 2018-10-30 DIAGNOSIS — C9000 Multiple myeloma not having achieved remission: Secondary | ICD-10-CM | POA: Diagnosis present

## 2018-10-30 DIAGNOSIS — R531 Weakness: Secondary | ICD-10-CM | POA: Diagnosis not present

## 2018-10-30 DIAGNOSIS — Z833 Family history of diabetes mellitus: Secondary | ICD-10-CM | POA: Diagnosis not present

## 2018-10-30 DIAGNOSIS — G9341 Metabolic encephalopathy: Secondary | ICD-10-CM | POA: Diagnosis present

## 2018-10-30 DIAGNOSIS — Z8673 Personal history of transient ischemic attack (TIA), and cerebral infarction without residual deficits: Secondary | ICD-10-CM

## 2018-10-30 DIAGNOSIS — Z66 Do not resuscitate: Secondary | ICD-10-CM | POA: Diagnosis present

## 2018-10-30 DIAGNOSIS — E1140 Type 2 diabetes mellitus with diabetic neuropathy, unspecified: Secondary | ICD-10-CM | POA: Diagnosis present

## 2018-10-30 DIAGNOSIS — L89320 Pressure ulcer of left buttock, unstageable: Secondary | ICD-10-CM | POA: Diagnosis present

## 2018-10-30 DIAGNOSIS — Z87891 Personal history of nicotine dependence: Secondary | ICD-10-CM

## 2018-10-30 DIAGNOSIS — Z791 Long term (current) use of non-steroidal anti-inflammatories (NSAID): Secondary | ICD-10-CM | POA: Diagnosis not present

## 2018-10-30 DIAGNOSIS — E43 Unspecified severe protein-calorie malnutrition: Secondary | ICD-10-CM | POA: Diagnosis present

## 2018-10-30 DIAGNOSIS — Z7189 Other specified counseling: Secondary | ICD-10-CM

## 2018-10-30 DIAGNOSIS — L8990 Pressure ulcer of unspecified site, unspecified stage: Secondary | ICD-10-CM

## 2018-10-30 DIAGNOSIS — Z794 Long term (current) use of insulin: Secondary | ICD-10-CM | POA: Diagnosis not present

## 2018-10-30 DIAGNOSIS — L89150 Pressure ulcer of sacral region, unstageable: Secondary | ICD-10-CM | POA: Diagnosis present

## 2018-10-30 DIAGNOSIS — Z79899 Other long term (current) drug therapy: Secondary | ICD-10-CM | POA: Diagnosis not present

## 2018-10-30 DIAGNOSIS — Z90710 Acquired absence of both cervix and uterus: Secondary | ICD-10-CM | POA: Diagnosis not present

## 2018-10-30 DIAGNOSIS — Z7901 Long term (current) use of anticoagulants: Secondary | ICD-10-CM | POA: Diagnosis not present

## 2018-10-30 DIAGNOSIS — Z515 Encounter for palliative care: Secondary | ICD-10-CM

## 2018-10-30 DIAGNOSIS — R6520 Severe sepsis without septic shock: Secondary | ICD-10-CM

## 2018-10-30 HISTORY — DX: Deep phlebothrombosis in pregnancy, unspecified trimester: O22.30

## 2018-10-30 HISTORY — DX: Type 2 diabetes mellitus with diabetic neuropathy, unspecified: E11.40

## 2018-10-30 HISTORY — DX: Malignant neoplasm of bone and articular cartilage, unspecified: C41.9

## 2018-10-30 HISTORY — DX: Type 2 diabetes mellitus without complications: E11.9

## 2018-10-30 LAB — CBC WITH DIFFERENTIAL/PLATELET
Abs Immature Granulocytes: 0.07 10*3/uL (ref 0.00–0.07)
Basophils Absolute: 0 10*3/uL (ref 0.0–0.1)
Basophils Relative: 0 %
Eosinophils Absolute: 0 10*3/uL (ref 0.0–0.5)
Eosinophils Relative: 0 %
HCT: 34.6 % — ABNORMAL LOW (ref 36.0–46.0)
Hemoglobin: 10.8 g/dL — ABNORMAL LOW (ref 12.0–15.0)
Immature Granulocytes: 1 %
Lymphocytes Relative: 19 %
Lymphs Abs: 2.4 10*3/uL (ref 0.7–4.0)
MCH: 30.2 pg (ref 26.0–34.0)
MCHC: 31.2 g/dL (ref 30.0–36.0)
MCV: 96.6 fL (ref 80.0–100.0)
Monocytes Absolute: 1 10*3/uL (ref 0.1–1.0)
Monocytes Relative: 8 %
NRBC: 0 % (ref 0.0–0.2)
Neutro Abs: 9.2 10*3/uL — ABNORMAL HIGH (ref 1.7–7.7)
Neutrophils Relative %: 72 %
Platelets: 303 10*3/uL (ref 150–400)
RBC: 3.58 MIL/uL — ABNORMAL LOW (ref 3.87–5.11)
RDW: 15.8 % — ABNORMAL HIGH (ref 11.5–15.5)
WBC: 12.8 10*3/uL — ABNORMAL HIGH (ref 4.0–10.5)

## 2018-10-30 LAB — BLOOD GAS, VENOUS
Acid-base deficit: 5.1 mmol/L — ABNORMAL HIGH (ref 0.0–2.0)
Bicarbonate: 21.2 mmol/L (ref 20.0–28.0)
FIO2: 0.28
O2 Saturation: 23.5 %
PCO2 VEN: 43 mmHg — AB (ref 44.0–60.0)
Patient temperature: 37
pH, Ven: 7.3 (ref 7.250–7.430)
pO2, Ven: <31 mmHg — CL (ref 32.0–45.0)

## 2018-10-30 LAB — COMPREHENSIVE METABOLIC PANEL
ALT: 29 U/L (ref 0–44)
AST: 21 U/L (ref 15–41)
Albumin: 3.7 g/dL (ref 3.5–5.0)
Alkaline Phosphatase: 198 U/L — ABNORMAL HIGH (ref 38–126)
Anion gap: 13 (ref 5–15)
BUN: 17 mg/dL (ref 8–23)
CO2: 20 mmol/L — AB (ref 22–32)
CREATININE: 1.2 mg/dL — AB (ref 0.44–1.00)
Calcium: 9.4 mg/dL (ref 8.9–10.3)
Chloride: 111 mmol/L (ref 98–111)
GFR calc non Af Amer: 47 mL/min — ABNORMAL LOW (ref >60)
GFR, EST AFRICAN AMERICAN: 54 mL/min — AB (ref >60)
Glucose, Bld: 204 mg/dL — ABNORMAL HIGH (ref 70–99)
Potassium: 3.8 mmol/L (ref 3.5–5.1)
Sodium: 144 mmol/L (ref 135–145)
Total Bilirubin: 0.5 mg/dL (ref 0.3–1.2)
Total Protein: 7.3 g/dL (ref 6.5–8.1)

## 2018-10-30 LAB — MRSA PCR SCREENING: MRSA by PCR: NEGATIVE

## 2018-10-30 LAB — LACTIC ACID, PLASMA: Lactic Acid, Venous: 1.2 mmol/L (ref 0.5–1.9)

## 2018-10-30 MED ORDER — SODIUM CHLORIDE 0.9 % IV SOLN
2.0000 g | Freq: Once | INTRAVENOUS | Status: AC
  Administered 2018-10-30: 2 g via INTRAVENOUS
  Filled 2018-10-30: qty 2

## 2018-10-30 MED ORDER — VANCOMYCIN HCL IN DEXTROSE 750-5 MG/150ML-% IV SOLN
750.0000 mg | INTRAVENOUS | Status: DC
  Filled 2018-10-30: qty 150

## 2018-10-30 MED ORDER — SODIUM CHLORIDE 0.9 % IV BOLUS
500.0000 mL | Freq: Once | INTRAVENOUS | Status: AC
  Administered 2018-10-30: 500 mL via INTRAVENOUS

## 2018-10-30 MED ORDER — VANCOMYCIN HCL IN DEXTROSE 1-5 GM/200ML-% IV SOLN
1000.0000 mg | Freq: Once | INTRAVENOUS | Status: AC
  Administered 2018-10-30: 1000 mg via INTRAVENOUS
  Filled 2018-10-30: qty 200

## 2018-10-30 MED ORDER — SODIUM CHLORIDE 0.9 % IV SOLN
2.0000 g | Freq: Two times a day (BID) | INTRAVENOUS | Status: DC
  Administered 2018-10-31 – 2018-11-02 (×5): 2 g via INTRAVENOUS
  Filled 2018-10-30 (×7): qty 2

## 2018-10-30 MED ORDER — ALBUTEROL SULFATE (2.5 MG/3ML) 0.083% IN NEBU
2.5000 mg | INHALATION_SOLUTION | Freq: Once | RESPIRATORY_TRACT | Status: AC
  Administered 2018-10-30: 2.5 mg via RESPIRATORY_TRACT
  Filled 2018-10-30: qty 3

## 2018-10-30 NOTE — Consult Note (Signed)
Pharmacy Antibiotic Note  Tasha Howard is a 68 y.o. female admitted on 10/30/2018 with pneumonia/sepsis  Pharmacy has been consulted for Vancomycin/Cefepime dosing.  Plan: Patient will receive 1g Vancomycin IV loading dose in the ED Followed by Vancomycin 750 mg IV Q 24 hrs. Goal AUC 400-550. Expected AUC: 546 SCr used: 1.2  Cefepime 2g q12h  Height: 5\' 6"  (167.6 cm) Weight: 116 lb 13.5 oz (53 kg) IBW/kg (Calculated) : 59.3  Temp (24hrs), Avg:98.3 F (36.8 C), Min:98.3 F (36.8 C), Max:98.3 F (36.8 C)  Recent Labs  Lab 10/30/18 1418  WBC 12.8*  CREATININE 1.20*  LATICACIDVEN 1.2    Estimated Creatinine Clearance: 38.1 mL/min (A) (by C-G formula based on SCr of 1.2 mg/dL (H)).    Allergies  Allergen Reactions  . Metformin And Related     Antimicrobials this admission: Vancomycin 2/26 >>   Cefepime 2/26 >>   Dose adjustments this admission: N/A  Microbiology results: 2/26 [id number]   2/26 MRSA PCR: pending  Thank you for allowing pharmacy to be a part of this patient's care.  Lu Duffel, PharmD, BCPS Clinical Pharmacist 10/30/2018 3:53 PM

## 2018-10-30 NOTE — Progress Notes (Signed)
Advanced Care Plan.  Purpose of Encounter: CODE STATUS. Parties in Attendance: The patient, RN and me. Patient's Decisional Capacity: Yes. Medical Story: Tasha Howard  is a 68 y.o. female with a known history of hypertension, diabetes, stroke, DVT, bone cancer.  The patient is being admitted for acute respiratory failure with hypoxia due to sepsis secondary to pneumonia.  I discussed with the patient about her current condition, poor prognosis and CODE STATUS.  She does want to be resuscitated and intubated if she has a cardiopulmonary arrest. Plan:  Code Status: Full code. Time spent discussing advance care planning: 17 minutes.

## 2018-10-30 NOTE — H&P (Addendum)
[facility] NAME: Tasha Howard    MR#:  [id number]  DATE OF BIRTH:  05/26/1951  DATE OF ADMISSION:  10/30/2018  PRIMARY CARE PHYSICIAN: Patient, No Pcp Per   REQUESTING/REFERRING PHYSICIAN: Dr. Quentin Cornwall.  CHIEF COMPLAINT:   Chief Complaint  Patient presents with  . Weakness   Generalized weakness, fever, cough for couple days. HISTORY OF PRESENT ILLNESS:  Tasha Howard  is a 68 y.o. female with a known history of hypertension, diabetes, stroke, DVT, bone cancer.  She recently had a influenza A and admitted to [facility] for evaluation of stroke.  The patient said she finished her flu medication.  She has had fever, chills, cough and generalized weakness for the past couple days.  She was discharged back to home from skilled nursing facility a week ago.  She is found sepsis with tachycardia and leukocytosis.  Chest x-ray report bronchopneumonia.  She is found hypoxia and put on oxygen by nasal cannula.  She is treated with cefepime and vancomycin in the ED. PAST MEDICAL HISTORY:   Past Medical History:  Diagnosis Date  . Bone cancer ([place])   . Cancer ([place])   . Diabetic neuropathy ([place])   . DM (diabetes mellitus) ([place])   . DVT (deep vein thrombosis) in pregnancy   . Hypertension   . Stroke [facility])     PAST SURGICAL HISTORY:   Past Surgical History:  Procedure Laterality Date  . ABDOMINAL HYSTERECTOMY      SOCIAL HISTORY:   Social History   Tobacco Use  . Smoking status: Former Research scientist (life sciences)  . Smokeless tobacco: Never Used  Substance Use Topics  . Alcohol use: Not Currently    FAMILY HISTORY:   Family History  Problem Relation Age of Onset  . Cancer Mother   . Diabetes Mother   . Heart disease Mother   . Cancer Father     DRUG ALLERGIES:   Allergies  Allergen Reactions  . Metformin And Related     REVIEW OF SYSTEMS:   Review of Systems  Constitutional: Positive for chills, fever and  malaise/fatigue.  HENT: Negative for sore throat.   Eyes: Negative for blurred vision and double vision.  Respiratory: Positive for cough. Negative for hemoptysis, sputum production, shortness of breath, wheezing and stridor.   Cardiovascular: Negative for chest pain, palpitations, orthopnea and leg swelling.  Gastrointestinal: Negative for abdominal pain, blood in stool, diarrhea, melena, nausea and vomiting.  Genitourinary: Negative for dysuria, flank pain and hematuria.  Musculoskeletal: Negative for back pain and joint pain.  Skin: Negative for rash.  Neurological: Negative for dizziness, sensory change, focal weakness, seizures, loss of consciousness, weakness and headaches.  Endo/Heme/Allergies: Negative for polydipsia.  Psychiatric/Behavioral: Negative for depression. The patient is not nervous/anxious.     MEDICATIONS AT HOME:   Prior to Admission medications   Medication Sig Start Date End Date Taking? Authorizing Provider  acetaminophen (TYLENOL) 325 MG tablet Take 650 mg by mouth every 6 (six) hours as needed for pain. 09/25/18   [provider]  albuterol (PROVENTIL HFA;VENTOLIN HFA) 108 (90 Base) MCG/ACT inhaler Inhale 2 puffs into the lungs every 6 (six) hours as needed for wheezing. 12/28/15   [provider]  apixaban (ELIQUIS) 2.5 MG TABS tablet Take 2.5 mg by mouth 2 (two) times daily. 09/25/18   [provider]  atorvastatin (LIPITOR) 80 MG tablet Take 80 mg by mouth every evening. 09/25/18 10/25/18  [provider]  butalbital-acetaminophen-caffeine (FIORICET, ESGIC) 50-325-40 MG tablet Take 1 tablet by mouth every 6 (six) hours as needed for headache. 09/25/18   [provider]  cetirizine (ZYRTEC) 10 MG tablet Take 10 mg by mouth daily. 03/28/17   [provider]  diclofenac sodium (VOLTAREN) 1 % GEL Apply 2 g topically 4 (four) times daily. 02/08/18 02/08/19  [provider]  DULoxetine (CYMBALTA) 60 MG capsule Take  60 mg by mouth daily. 09/26/18 10/26/18  [provider]  furosemide (LASIX) 20 MG tablet Take 20 mg by mouth daily. 04/17/18   [provider]  insulin glargine (LANTUS) 100 UNIT/ML injection Inject 6 Units into the skin Nightly. 09/25/18 10/25/18  [provider]  insulin lispro (HUMALOG) 100 UNIT/ML injection Inject 5 Units into the skin 3 (three) times daily. 09/25/18 10/25/18  [provider]  losartan (COZAAR) 25 MG tablet Take 12.5 mg by mouth daily. 09/26/18 10/26/18  [provider]  Melatonin 3 MG TABS Take 6 mg by mouth every evening. 09/25/18   [provider]  metoprolol tartrate (LOPRESSOR) 25 MG tablet Take 12.5 mg by mouth 2 (two) times daily. 09/25/18 10/25/18  [provider]  mirtazapine (REMERON) 15 MG tablet Take 15 mg by mouth Nightly. 07/30/18   [provider]  Multiple Minerals (MULTI-MINERALS PO) Take 1 tablet by mouth daily. 09/26/18   [provider]  nitroGLYCERIN (NITROLINGUAL) 0.4 MG/SPRAY spray Place 0.4 mg under the tongue as directed. 07/12/18 07/12/19  [provider]  oxyCODONE-acetaminophen (PERCOCET) 7.5-325 MG tablet Take 1 tablet by mouth every 8 (eight) hours as needed for pain. 08/30/18   [provider]  pomalidomide (POMALYST) 4 MG capsule Take 4 mg by mouth daily. 08/26/18   [provider]  pregabalin (LYRICA) 75 MG capsule Take 75 mg by mouth 2 (two) times daily. 08/30/18 11/28/18  [provider]  thiamine 100 MG tablet Take 200 mg by mouth 2 (two) times daily. 09/25/18 09/25/19  [provider]  valACYclovir (VALTREX) 500 MG tablet Take 500 mg by mouth daily. 06/03/18   [provider]  vitamin C (ASCORBIC ACID) 500 MG tablet Take 500 mg by mouth 2 (two) times daily.    [provider]  zinc sulfate 220 (50 Zn) MG capsule Take 220 mg by mouth daily.    [provider]      VITAL SIGNS:  Blood pressure (!) 144/79,  pulse (!) 118, temperature 98.3 F (36.8 C), temperature source Oral, resp. rate 18, height 5\' 6"  (1.676 m), weight 53 kg, SpO2 99 %.  PHYSICAL EXAMINATION:  Physical Exam  GENERAL:  68 y.o.-year-old patient lying in the bed with no acute distress.  Severe malnutrition. EYES: Pupils equal, round, reactive to light and accommodation. No scleral icterus. Extraocular muscles intact.  HEENT: Head atraumatic, normocephalic. Oropharynx and nasopharynx clear.  NECK:  Supple, no jugular venous distention. No thyroid enlargement, no tenderness.  LUNGS: Coarse breath sounds breath sounds bilaterally, no wheezing, rales,rhonchi or crepitation. No use of accessory muscles of respiration.  CARDIOVASCULAR: S1, S2 normal. No murmurs, rubs, or gallops.  ABDOMEN: Soft, nontender, nondistended. Bowel sounds present. No organomegaly or mass.  EXTREMITIES: No pedal edema, cyanosis, or clubbing.  Necrotic skin changes on right heel. NEUROLOGIC: Cranial nerves II through XII are intact. Muscle strength 5/5 in all extremities. Sensation intact. Gait not checked.  PSYCHIATRIC: The patient is alert and oriented x 3.  SKIN: No obvious rash, lesion, or ulcer.   LABORATORY PANEL:  CBC Recent Labs  Lab 10/30/18 1418  WBC 12.8*  HGB 10.8*  HCT 34.6*  PLT 303   ------------------------------------------------------------------------------------------------------------------  Chemistries  Recent Labs  Lab 10/30/18 1418  NA 144  K 3.8  CL 111  CO2 20*  GLUCOSE 204*  BUN 17  CREATININE 1.20*  CALCIUM 9.4  AST 21  ALT 29  ALKPHOS 198*  BILITOT 0.5   ------------------------------------------------------------------------------------------------------------------  Cardiac Enzymes No results for input(s): TROPONINI in the last 168 hours. ------------------------------------------------------------------------------------------------------------------  RADIOLOGY:  Dg Chest 2 View  Result Date:  10/30/2018 CLINICAL DATA:  Tachycardia, fever and decreased oxygen saturation. EXAM: CHEST - 2 VIEW COMPARISON:  08/27/2013 FINDINGS: Cardiomediastinal silhouette is normal. Mediastinal contours appear intact. Tortuosity of the aorta. There is no evidence pleural effusion or pneumothorax. Peribronchial thickening with central predominance, and bilateral lower lobe peribronchial airspace consolidation. Osseous structures are without acute abnormality. Soft tissues are grossly normal. IMPRESSION: Peribronchial thickening with central predominance, and bilateral lower lobe peribronchial airspace consolidation. Findings may represent acute bronchitis with atelectasis or developing bronchopneumonia. Electronically Signed   By: Fidela Salisbury M.D.   On: 10/30/2018 15:33      IMPRESSION AND PLAN:   Acute respiratory failure with hypoxia due to sepsis secondary to pneumonia. The patient will be admitted to medical floor. Continue cefepime and vancomycin, Robitussin as needed, IV fluid support. DuoNeb as needed.  Continue oxygen by nasal cannula.  Follow-up cultures. HTN.  Hold Lasix but continue Lopressor and losartan. DM.  Start sliding scale. Recent CVA.  Continue Eliquis and Lipitor Severe malnutrition.  Dietitian consult.  Tobacco abuse.  Smoking cessation was counseled for 3 to 4 minutes. All the records are reviewed and case discussed with ED provider. Management plans discussed with the patient, family and they are in agreement.  CODE STATUS: Full code.  TOTAL TIME TAKING CARE OF THIS PATIENT: 36 minutes.    Demetrios Loll M.D on 10/30/2018 at 3:53 PM  Between 7am to 6pm - Pager - [phone]  After 6pm go to [email or web address] - Proofreader  [facility]  Office  [phone]  CC: Primary care physician; Patient, No Pcp Per   Note: This dictation was prepared with Dragon dictation along with smaller phrase technology. Any transcriptional errors that  result from this process are unin

## 2018-10-30 NOTE — ED Provider Notes (Signed)
[facility] Emergency Department Provider Note    First MD Initiated Contact with Patient 10/30/18 1408     (approximate)  I have reviewed the triage vital signs and the nursing notes.   HISTORY  Chief Complaint Weakness    HPI Tasha Howard is a 68 y.o. female below listed past medical history presents the ER for fever weakness chills and cough.  Recently diagnosed with flu A and admitted to [facility] for evaluation of stroke.Marland Kitchen  She was discharged back home to skilled nursing facility on the 19th.  Not currently on any antibiotics.  Patient found to be satting in the low 80s by EMS placed on 2 L nasal cannula.  Patient unable to provide much additional history.  Denies any focal pain or discomfort   Past Medical History:  Diagnosis Date  . Bone cancer ([place])   . Cancer ([place])   . Hypertension   . Stroke [facility])    History reviewed. No pertinent family history. Past Surgical History:  Procedure Laterality Date  . ABDOMINAL HYSTERECTOMY     There are no active problems to display for this patient.     Prior to Admission medications   Medication Sig Start Date End Date Taking? Authorizing Provider  acetaminophen (TYLENOL) 325 MG tablet Take 650 mg by mouth every 6 (six) hours as needed for pain. 09/25/18   [provider]  albuterol (PROVENTIL HFA;VENTOLIN HFA) 108 (90 Base) MCG/ACT inhaler Inhale 2 puffs into the lungs every 6 (six) hours as needed for wheezing. 12/28/15   [provider]  apixaban (ELIQUIS) 2.5 MG TABS tablet Take 2.5 mg by mouth 2 (two) times daily. 09/25/18   [provider]  atorvastatin (LIPITOR) 80 MG tablet Take 80 mg by mouth every evening. 09/25/18 10/25/18  [provider]  butalbital-acetaminophen-caffeine (FIORICET, ESGIC) 50-325-40 MG tablet Take 1 tablet by mouth every 6 (six) hours as needed for headache. 09/25/18   [provider]  cetirizine (ZYRTEC) 10 MG tablet Take 10 mg  by mouth daily. 03/28/17   [provider]  diclofenac sodium (VOLTAREN) 1 % GEL Apply 2 g topically 4 (four) times daily. 02/08/18 02/08/19  [provider]  DULoxetine (CYMBALTA) 60 MG capsule Take 60 mg by mouth daily. 09/26/18 10/26/18  [provider]  furosemide (LASIX) 20 MG tablet Take 20 mg by mouth daily. 04/17/18   [provider]  insulin glargine (LANTUS) 100 UNIT/ML injection Inject 6 Units into the skin Nightly. 09/25/18 10/25/18  [provider]  insulin lispro (HUMALOG) 100 UNIT/ML injection Inject 5 Units into the skin 3 (three) times daily. 09/25/18 10/25/18  [provider]  losartan (COZAAR) 25 MG tablet Take 12.5 mg by mouth daily. 09/26/18 10/26/18  [provider]  Melatonin 3 MG TABS Take 6 mg by mouth every evening. 09/25/18   [provider]  metoprolol tartrate (LOPRESSOR) 25 MG tablet Take 12.5 mg by mouth 2 (two) times daily. 09/25/18 10/25/18  [provider]  mirtazapine (REMERON) 15 MG tablet Take 15 mg by mouth Nightly. 07/30/18   [provider]  Multiple Minerals (MULTI-MINERALS PO) Take 1 tablet by mouth daily. 09/26/18   [provider]  nitroGLYCERIN (NITROLINGUAL) 0.4 MG/SPRAY spray Place 0.4 mg under the tongue as directed. 07/12/18 07/12/19  [provider]  oxyCODONE-acetaminophen (PERCOCET) 7.5-325 MG tablet Take 1 tablet by mouth every 8 (eight) hours as needed for pain. 08/30/18   [provider]  pomalidomide (POMALYST) 4  MG capsule Take 4 mg by mouth daily. 08/26/18   [provider]  pregabalin (LYRICA) 75 MG capsule Take 75 mg by mouth 2 (two) times daily. 08/30/18 11/28/18  [provider]  thiamine 100 MG tablet Take 200 mg by mouth 2 (two) times daily. 09/25/18 09/25/19  [provider]  valACYclovir (VALTREX) 500 MG tablet Take 500 mg by mouth daily. 06/03/18   [provider]  vitamin C (ASCORBIC ACID) 500 MG tablet  Take 500 mg by mouth 2 (two) times daily.    [provider]  zinc sulfate 220 (50 Zn) MG capsule Take 220 mg by mouth daily.    [provider]    Allergies Metformin and related    Social History Social History   Tobacco Use  . Smoking status: Former Research scientist (life sciences)  . Smokeless tobacco: Never Used  Substance Use Topics  . Alcohol use: Not Currently  . Drug use: Not Currently    Review of Systems Patient denies headaches, rhinorrhea, blurry vision, numbness, shortness of breath, chest pain, edema, cough, abdominal pain, nausea, vomiting, diarrhea, dysuria, fevers, rashes or hallucinations unless otherwise stated above in HPI. ____________________________________________   PHYSICAL EXAM:  VITAL SIGNS: Vitals:   10/30/18 1430 10/30/18 1507  BP: 138/76 (!) 153/77  Pulse: (!) 111 (!) 111  Resp: 16 17  Temp:    SpO2: 96% 95%    Constitutional: Alert, ill and frail appearing,  Eyes: Conjunctivae are normal.  Head: Atraumatic. Nose: No congestion/rhinnorhea. Mouth/Throat: Mucous membranes are moist.   Neck: No stridor. Painless ROM.  Cardiovascular: Normal rate, regular rhythm. Grossly normal heart sounds.  Good peripheral circulation. Respiratory: Normal respiratory effort.  No retractions. Lungs CTAB. Gastrointestinal: Soft and nontender. No distention. No abdominal bruits. No CVA tenderness. Genitourinary: deferred Musculoskeletal: No lower extremity tenderness nor edema.  No joint effusions. Neurologic:  No new focal neurologic deficits are appreciated.  Skin:  Skin is warm, dry and intact. No rash noted. Psychiatric: Mood and affect are normal. Speech and behavior are normal.  ____________________________________________   LABS (all labs ordered are listed, but only abnormal results are displayed)  Results for orders placed or performed during the hospital encounter of 10/30/18 (from the past 24 hour(s))  Lactic acid, plasma     Status: None    Collection Time: 10/30/18  2:18 PM  Result Value Ref Range   Lactic Acid, Venous 1.2 0.5 - 1.9 mmol/L  Comprehensive metabolic panel     Status: Abnormal   Collection Time: 10/30/18  2:18 PM  Result Value Ref Range   Sodium 144 135 - 145 mmol/L   Potassium 3.8 3.5 - 5.1 mmol/L   Chloride 111 98 - 111 mmol/L   CO2 20 (L) 22 - 32 mmol/L   Glucose, Bld 204 (H) 70 - 99 mg/dL   BUN 17 8 - 23 mg/dL   Creatinine, Ser 1.20 (H) 0.44 - 1.00 mg/dL   Calcium 9.4 8.9 - 10.3 mg/dL   Total Protein 7.3 6.5 - 8.1 g/dL   Albumin 3.7 3.5 - 5.0 g/dL   AST 21 15 - 41 U/L   ALT 29 0 - 44 U/L   Alkaline Phosphatase 198 (H) 38 - 126 U/L   Total Bilirubin 0.5 0.3 - 1.2 mg/dL   GFR calc non Af Amer 47 (L) >60 mL/min   GFR calc Af Amer 54 (L) >60 mL/min   Anion gap 13 5 - 15  CBC WITH DIFFERENTIAL     Status: Abnormal  Collection Time: 10/30/18  2:18 PM  Result Value Ref Range   WBC 12.8 (H) 4.0 - 10.5 K/uL   RBC 3.58 (L) 3.87 - 5.11 MIL/uL   Hemoglobin 10.8 (L) 12.0 - 15.0 g/dL   HCT 34.6 (L) 36.0 - 46.0 %   MCV 96.6 80.0 - 100.0 fL   MCH 30.2 26.0 - 34.0 pg   MCHC 31.2 30.0 - 36.0 g/dL   RDW 15.8 (H) 11.5 - 15.5 %   Platelets 303 150 - 400 K/uL   nRBC 0.0 0.0 - 0.2 %   Neutrophils Relative % 72 %   Neutro Abs 9.2 (H) 1.7 - 7.7 K/uL   Lymphocytes Relative 19 %   Lymphs Abs 2.4 0.7 - 4.0 K/uL   Monocytes Relative 8 %   Monocytes Absolute 1.0 0.1 - 1.0 K/uL   Eosinophils Relative 0 %   Eosinophils Absolute 0.0 0.0 - 0.5 K/uL   Basophils Relative 0 %   Basophils Absolute 0.0 0.0 - 0.1 K/uL   Immature Granulocytes 1 %   Abs Immature Granulocytes 0.07 0.00 - 0.07 K/uL  Blood gas, venous     Status: Abnormal   Collection Time: 10/30/18  2:36 PM  Result Value Ref Range   FIO2 0.28    Delivery systems NASAL CANNULA    pH, Ven 7.30 7.250 - 7.430   pCO2, Ven 43 (L) 44.0 - 60.0 mmHg   pO2, Ven <31.0 (LL) 32.0 - 45.0 mmHg   Bicarbonate 21.2 20.0 - 28.0 mmol/L   Acid-base deficit 5.1 (H) 0.0 - 2.0  mmol/L   O2 Saturation 23.5 %   Patient temperature 37.0    Collection site VENOUS    Sample type VENOUS    ____________________________________________  EKG My review and personal interpretation at Time: 14:18   Indication: sepsis  Rate: 115  Rhythm: sinus Axis: normal  Other: normal intervals, no stemi ____________________________________________  RADIOLOGY  I personally reviewed all radiographic images ordered to evaluate for the above acute complaints and reviewed radiology reports and findings.  These findings were personally discussed with the patient.  Please see medical record for radiology report.  ____________________________________________   PROCEDURES  Procedure(s) performed:  .Critical Care Performed by: Merlyn Lot, MD Authorized by: Merlyn Lot, MD   Critical care provider statement:    Critical care time (minutes):  30   Critical care time was exclusive of:  Separately billable procedures and treating other patients   Critical care was necessary to treat or prevent imminent or life-threatening deterioration of the following conditions:  Respiratory failure and sepsis   Critical care was time spent personally by me on the following activities:  Development of treatment plan with patient or surrogate, discussions with consultants, evaluation of patient's response to treatment, examination of patient, obtaining history from patient or surrogate, ordering and performing treatments and interventions, ordering and review of laboratory studies, ordering and review of radiographic studies, pulse oximetry, re-evaluation of patient's condition and review of old charts      Critical Care performed: yes ____________________________________________   INITIAL IMPRESSION / [place] / ED COURSE  Pertinent labs & imaging results that were available during my care of the patient were reviewed by me and considered in my medical decision making (see  chart for details).   DDX: pna, copd, chf, uti, bacteremia  Jerilee Space is a 68 y.o. who presents to the ED with sepsis likely secondary pneumonia.  Does have rhonchi and wheeze on the right  lung fields.  Will give nebulizer.  Will order blood work to evaluate for sepsis.  As she was recently diagnosed with flu a will cover for MRSA pneumonia as well.  Patient is with acute hypoxic respiratory failure but stabilizing on supplemental oxygen.  Patient will require hospitalization for IV antibiotics and further medical management.      As part of my medical decision making, I reviewed the following data within the [place] notes reviewed and incorporated, Labs reviewed, notes from prior ED visits and [place] Controlled Substance Database   ____________________________________________   FINAL CLINICAL IMPRESSION(S) / ED DIAGNOSES  Final diagnoses:  Sepsis with acute hypoxic respiratory failure without septic shock, due to unspecified organism [facility])      NEW MEDICATIONS STARTED DURING THIS VISIT:  New Prescriptions   No medications on file     Note:  This document was prepared using Dragon voice recognition software and may include unintentional dictation errors.    Merlyn Lot, MD 10/30/18 [phone]

## 2018-10-30 NOTE — ED Notes (Signed)
MD at bedside. 

## 2018-10-30 NOTE — ED Notes (Signed)
Pt c/o "butt pain". This RN place a pillow under left side to provide comfort and relief pressure. Pt states "I feel better".

## 2018-10-30 NOTE — Progress Notes (Signed)
[facility]  Patient Status: Active  [facility] is providing the following services: SN, PT, OT  If patient discharges after hours, please call [phone].   Tasha Howard 10/30/2018, 7:16 PM

## 2018-10-30 NOTE — ED Triage Notes (Signed)
Pt comes from home with generalized weakness. EMS called a code sepsis for tachycardia, fever, and decreased CO2. Pt has had two strokes in the past month and has left sided deficits. Also has bone cancer. Pt dx with flu last week. Pt placed on 4L O2 due to low oxygen with EMS. EMS reports wheezing on left side. CBG of 237.

## 2018-10-30 NOTE — ED Notes (Signed)
Pt denies CP/SOB/ dizziness. Pt denies pain or discomfort at this time.

## 2018-10-30 NOTE — Consult Note (Signed)
CODE SEPSIS - PHARMACY COMMUNICATION  **Broad Spectrum Antibiotics should be administered within 1 hour of Sepsis diagnosis**  Time Code Sepsis Called/Page Received: 1429  Antibiotics Ordered: Vancomycin/Cefepime  Time of 1st antibiotic administration: 1554  Additional action taken by pharmacy: Followed up with nursing ~ 1510 - MD awaiting chest xray results before ordering  If necessary, Name of Provider/Nurse Contacted: Arvid Right ,PharmD Clinical Pharmacist  10/30/2018  2:50 PM

## 2018-10-31 ENCOUNTER — Inpatient Hospital Stay: Payer: Medicare HMO

## 2018-10-31 DIAGNOSIS — L8990 Pressure ulcer of unspecified site, unspecified stage: Secondary | ICD-10-CM

## 2018-10-31 DIAGNOSIS — E43 Unspecified severe protein-calorie malnutrition: Secondary | ICD-10-CM

## 2018-10-31 LAB — URINALYSIS, ROUTINE W REFLEX MICROSCOPIC
Bacteria, UA: NONE SEEN
Bilirubin Urine: NEGATIVE
Glucose, UA: NEGATIVE mg/dL
Ketones, ur: 5 mg/dL — AB
Nitrite: NEGATIVE
Protein, ur: 30 mg/dL — AB
Specific Gravity, Urine: 1.014 (ref 1.005–1.030)
WBC, UA: >50 WBC/hpf — ABNORMAL HIGH (ref 0–5)
pH: 6 (ref 5.0–8.0)

## 2018-10-31 LAB — APTT: aPTT: 31 seconds (ref 24–36)

## 2018-10-31 LAB — BASIC METABOLIC PANEL
Anion gap: 11 (ref 5–15)
BUN: 17 mg/dL (ref 8–23)
CO2: 18 mmol/L — ABNORMAL LOW (ref 22–32)
Calcium: 8.9 mg/dL (ref 8.9–10.3)
Chloride: 113 mmol/L — ABNORMAL HIGH (ref 98–111)
Creatinine, Ser: 1.1 mg/dL — ABNORMAL HIGH (ref 0.44–1.00)
GFR calc Af Amer: >60 mL/min (ref >60)
GFR calc non Af Amer: 52 mL/min — ABNORMAL LOW (ref >60)
Glucose, Bld: 230 mg/dL — ABNORMAL HIGH (ref 70–99)
Potassium: 4 mmol/L (ref 3.5–5.1)
Sodium: 142 mmol/L (ref 135–145)

## 2018-10-31 LAB — CBC
HCT: 30.9 % — ABNORMAL LOW (ref 36.0–46.0)
Hemoglobin: 10 g/dL — ABNORMAL LOW (ref 12.0–15.0)
MCH: 31.3 pg (ref 26.0–34.0)
MCHC: 32.4 g/dL (ref 30.0–36.0)
MCV: 96.6 fL (ref 80.0–100.0)
Platelets: 259 10*3/uL (ref 150–400)
RBC: 3.2 MIL/uL — ABNORMAL LOW (ref 3.87–5.11)
RDW: 15.8 % — AB (ref 11.5–15.5)
WBC: 14.8 10*3/uL — ABNORMAL HIGH (ref 4.0–10.5)
nRBC: 0 % (ref 0.0–0.2)

## 2018-10-31 LAB — AMMONIA: Ammonia: 38 umol/L — ABNORMAL HIGH (ref 9–35)

## 2018-10-31 LAB — GLUCOSE, CAPILLARY
Glucose-Capillary: 184 mg/dL — ABNORMAL HIGH (ref 70–99)
Glucose-Capillary: 104 mg/dL — ABNORMAL HIGH (ref 70–99)
Glucose-Capillary: 122 mg/dL — ABNORMAL HIGH (ref 70–99)
Glucose-Capillary: 219 mg/dL — ABNORMAL HIGH (ref 70–99)

## 2018-10-31 LAB — PROTIME-INR
INR: 1.2 (ref 0.8–1.2)
Prothrombin Time: 14.8 seconds (ref 11.4–15.2)

## 2018-10-31 LAB — PROCALCITONIN: Procalcitonin: <0.1 ng/mL

## 2018-10-31 MED ORDER — COLLAGENASE 250 UNIT/GM EX OINT
TOPICAL_OINTMENT | Freq: Every day | CUTANEOUS | Status: DC
  Administered 2018-10-31 – 2018-11-02 (×3): via TOPICAL
  Filled 2018-10-31: qty 30

## 2018-10-31 MED ORDER — BISACODYL 5 MG PO TBEC: 5.0000 mg | DELAYED_RELEASE_TABLET | Freq: Every day | ORAL | Status: DC | PRN

## 2018-10-31 MED ORDER — DULOXETINE HCL 30 MG PO CPEP
60.0000 mg | ORAL_CAPSULE | Freq: Every day | ORAL | Status: DC
  Administered 2018-11-01 – 2018-11-02 (×2): 60 mg via ORAL
  Filled 2018-10-31 (×3): qty 2

## 2018-10-31 MED ORDER — ZINC SULFATE 220 (50 ZN) MG PO CAPS
220.0000 mg | ORAL_CAPSULE | Freq: Every day | ORAL | Status: DC
  Administered 2018-11-01 – 2018-11-02 (×2): 220 mg via ORAL
  Filled 2018-10-31 (×3): qty 1

## 2018-10-31 MED ORDER — DIPHENHYDRAMINE HCL 25 MG PO CAPS: 25.0000 mg | ORAL_CAPSULE | Freq: Every evening | ORAL | Status: DC

## 2018-10-31 MED ORDER — ACETAMINOPHEN 650 MG RE SUPP: 650.0000 mg | Freq: Four times a day (QID) | RECTAL | Status: DC | PRN

## 2018-10-31 MED ORDER — ACETAMINOPHEN 325 MG PO TABS: 650.0000 mg | ORAL_TABLET | Freq: Four times a day (QID) | ORAL | Status: DC | PRN

## 2018-10-31 MED ORDER — PREGABALIN 75 MG PO CAPS
75.0000 mg | ORAL_CAPSULE | Freq: Two times a day (BID) | ORAL | Status: DC
  Administered 2018-10-31 – 2018-11-02 (×5): 75 mg via ORAL
  Filled 2018-10-31 (×6): qty 1

## 2018-10-31 MED ORDER — NITROGLYCERIN 0.4 MG SL SUBL: 0.4000 mg | SUBLINGUAL_TABLET | SUBLINGUAL | Status: DC | PRN

## 2018-10-31 MED ORDER — METOPROLOL TARTRATE 25 MG PO TABS
12.5000 mg | ORAL_TABLET | Freq: Two times a day (BID) | ORAL | Status: DC
  Administered 2018-10-31 – 2018-11-02 (×5): 12.5 mg via ORAL
  Filled 2018-10-31 (×6): qty 1

## 2018-10-31 MED ORDER — MIRTAZAPINE 15 MG PO TABS
15.0000 mg | ORAL_TABLET | Freq: Every day | ORAL | Status: DC
  Administered 2018-10-31 – 2018-11-01 (×2): 15 mg via ORAL
  Filled 2018-10-31 (×2): qty 1

## 2018-10-31 MED ORDER — LOSARTAN POTASSIUM 25 MG PO TABS
12.5000 mg | ORAL_TABLET | Freq: Every day | ORAL | Status: DC
  Administered 2018-11-01 – 2018-11-02 (×2): 12.5 mg via ORAL
  Filled 2018-10-31 (×3): qty 1

## 2018-10-31 MED ORDER — ONDANSETRON HCL 4 MG/2ML IJ SOLN: 4.0000 mg | Freq: Four times a day (QID) | INTRAMUSCULAR | Status: DC | PRN

## 2018-10-31 MED ORDER — ADULT MULTIVITAMIN W/MINERALS CH
1.0000 | ORAL_TABLET | Freq: Every day | ORAL | Status: DC
  Administered 2018-11-01 – 2018-11-02 (×2): 1 via ORAL
  Filled 2018-10-31 (×3): qty 1

## 2018-10-31 MED ORDER — ALBUTEROL SULFATE (2.5 MG/3ML) 0.083% IN NEBU: 2.5000 mg | INHALATION_SOLUTION | RESPIRATORY_TRACT | Status: DC | PRN

## 2018-10-31 MED ORDER — VITAMIN C 500 MG PO TABS
500.0000 mg | ORAL_TABLET | Freq: Two times a day (BID) | ORAL | Status: DC
  Administered 2018-10-31 – 2018-11-02 (×5): 500 mg via ORAL
  Filled 2018-10-31 (×6): qty 1

## 2018-10-31 MED ORDER — DICLOFENAC SODIUM 1 % TD GEL
2.0000 g | Freq: Four times a day (QID) | TRANSDERMAL | Status: DC
  Administered 2018-10-31 – 2018-11-02 (×5): 2 g via TOPICAL
  Filled 2018-10-31: qty 100

## 2018-10-31 MED ORDER — LORATADINE 10 MG PO TABS
10.0000 mg | ORAL_TABLET | Freq: Every day | ORAL | Status: DC
  Administered 2018-11-01 – 2018-11-02 (×2): 10 mg via ORAL
  Filled 2018-10-31 (×3): qty 1

## 2018-10-31 MED ORDER — MELATONIN 3 MG PO TABS: 6.0000 mg | ORAL_TABLET | Freq: Every evening | ORAL | Status: DC

## 2018-10-31 MED ORDER — GUAIFENESIN 100 MG/5ML PO SOLN: 5.0000 mL | ORAL | Status: DC | PRN

## 2018-10-31 MED ORDER — INSULIN ASPART 100 UNIT/ML ~~LOC~~ SOLN
0.0000 [IU] | Freq: Three times a day (TID) | SUBCUTANEOUS | Status: DC
  Administered 2018-10-31: 17:00:00 1 [IU] via SUBCUTANEOUS
  Administered 2018-10-31 – 2018-11-01 (×2): 2 [IU] via SUBCUTANEOUS
  Administered 2018-11-01: 19:00:00 5 [IU] via SUBCUTANEOUS
  Administered 2018-11-01 – 2018-11-02 (×3): 2 [IU] via SUBCUTANEOUS
  Filled 2018-10-31 (×7): qty 1

## 2018-10-31 MED ORDER — SENNOSIDES-DOCUSATE SODIUM 8.6-50 MG PO TABS: 1.0000 | ORAL_TABLET | Freq: Every evening | ORAL | Status: DC | PRN

## 2018-10-31 MED ORDER — ONDANSETRON HCL 4 MG PO TABS: 4.0000 mg | ORAL_TABLET | Freq: Four times a day (QID) | ORAL | Status: DC | PRN

## 2018-10-31 MED ORDER — CHLORHEXIDINE GLUCONATE 0.12 % MT SOLN
15.0000 mL | Freq: Two times a day (BID) | OROMUCOSAL | Status: DC
  Administered 2018-10-31 – 2018-11-02 (×4): 15 mL via OROMUCOSAL
  Filled 2018-10-31 (×5): qty 15

## 2018-10-31 MED ORDER — NICOTINE 14 MG/24HR TD PT24
14.0000 mg | MEDICATED_PATCH | Freq: Every day | TRANSDERMAL | Status: DC
  Administered 2018-10-31 – 2018-11-02 (×3): 14 mg via TRANSDERMAL
  Filled 2018-10-31 (×4): qty 1

## 2018-10-31 MED ORDER — KETOROLAC TROMETHAMINE 30 MG/ML IJ SOLN: 15.0000 mg | Freq: Four times a day (QID) | INTRAMUSCULAR | Status: DC | PRN

## 2018-10-31 MED ORDER — VITAMIN B-1 100 MG PO TABS
200.0000 mg | ORAL_TABLET | Freq: Two times a day (BID) | ORAL | Status: DC
  Administered 2018-10-31 – 2018-11-02 (×5): 200 mg via ORAL
  Filled 2018-10-31 (×6): qty 2

## 2018-10-31 MED ORDER — HYDROCODONE-ACETAMINOPHEN 5-325 MG PO TABS
1.0000 | ORAL_TABLET | ORAL | Status: DC | PRN
  Administered 2018-10-31: 1 via ORAL
  Filled 2018-10-31: qty 1

## 2018-10-31 MED ORDER — SODIUM CHLORIDE 0.9 % IV SOLN
INTRAVENOUS | Status: DC
  Administered 2018-10-31 (×2): via INTRAVENOUS

## 2018-10-31 MED ORDER — APIXABAN 2.5 MG PO TABS
2.5000 mg | ORAL_TABLET | Freq: Two times a day (BID) | ORAL | Status: DC
  Administered 2018-10-31 (×2): 2.5 mg via ORAL
  Filled 2018-10-31 (×3): qty 1

## 2018-10-31 MED ORDER — ATORVASTATIN CALCIUM 20 MG PO TABS
80.0000 mg | ORAL_TABLET | Freq: Every evening | ORAL | Status: DC
  Administered 2018-11-01: 17:00:00 80 mg via ORAL
  Filled 2018-10-31: qty 4

## 2018-10-31 MED ORDER — ENSURE ENLIVE PO LIQD: 237.0000 mL | Freq: Two times a day (BID) | ORAL | Status: DC

## 2018-10-31 MED ORDER — INFLUENZA VAC SPLIT HIGH-DOSE 0.5 ML IM SUSY
0.5000 mL | PREFILLED_SYRINGE | INTRAMUSCULAR | Status: DC
  Filled 2018-10-31: qty 0.5

## 2018-10-31 MED ORDER — ORAL CARE MOUTH RINSE
15.0000 mL | Freq: Two times a day (BID) | OROMUCOSAL | Status: DC
  Administered 2018-10-31 – 2018-11-02 (×5): 15 mL via OROMUCOSAL

## 2018-10-31 MED ORDER — VALACYCLOVIR HCL 500 MG PO TABS
500.0000 mg | ORAL_TABLET | Freq: Every day | ORAL | Status: DC
  Administered 2018-11-01 – 2018-11-02 (×2): 500 mg via ORAL
  Filled 2018-10-31 (×3): qty 1

## 2018-10-31 MED ORDER — BUTALBITAL-APAP-CAFFEINE 50-325-40 MG PO TABS
1.0000 | ORAL_TABLET | Freq: Four times a day (QID) | ORAL | Status: DC | PRN
  Filled 2018-10-31: qty 1

## 2018-10-31 NOTE — Progress Notes (Signed)
PT Cancellation Note  Patient Details Name: Tasha Howard MRN: [id number] DOB: [birth year]   Cancelled Treatment:    Reason Eval/Treat Not Completed: Patient not medically ready - Evaluation attempted, patient is too lethargic. Will re-attempt tomorrow.    Kasara Schomer 10/31/2018, 2:36 PM

## 2018-10-31 NOTE — ED Notes (Signed)
ED TO INPATIENT HANDOFF REPORT  Name/Age/Gender Tasha Howard 67 y.o. female  Code Status   Home/SNF/Other home  Chief Complaint weakness  Level of Care/Admitting Diagnosis ED Disposition    ED Disposition Condition [place]: [place] [100120]  Level of Care: Med-Surg [16]  Diagnosis: Sepsis [facility]) [[id number]]  Admitting Physician: Demetrios Loll [[id number]]  Attending Physician: Demetrios Loll [[id number]]  Estimated length of stay: 3 - 4 days  Certification:: I certify this patient will need inpatient services for at least 2 midnights  PT Class (Do Not Modify): Inpatient [101]  PT Acc Code (Do Not Modify): Private [1]       Medical History Past Medical History:  Diagnosis Date  . Bone cancer ([place])   . Cancer ([place])   . Diabetic neuropathy ([place])   . DM (diabetes mellitus) ([place])   . DVT (deep vein thrombosis) in pregnancy   . Hypertension   . Stroke [facility])     Allergies Allergies  Allergen Reactions  . Metformin And Related Nausea Only    IV Location/Drains/Wounds Patient Lines/Drains/Airways Status   Active Line/Drains/Airways    Name:   Placement date:   Placement time:   Site:   Days:   Peripheral IV 10/30/18 Left Antecubital   10/30/18    1430    Antecubital   1   Peripheral IV 10/30/18 Right Antecubital   10/30/18    1430    Antecubital   1          Labs/Imaging Results for orders placed or performed during the hospital encounter of 10/30/18 (from the past 48 hour(s))  Lactic acid, plasma     Status: None   Collection Time: 10/30/18  2:18 PM  Result Value Ref Range   Lactic Acid, Venous 1.2 0.5 - 1.9 mmol/L    Comment: Performed at [facility], [address] [place], [place] [address]  Comprehensive metabolic panel     Status: Abnormal   Collection Time: 10/30/18  2:18 PM  Result Value Ref Range   Sodium 144 135 - 145 mmol/L   Potassium 3.8 3.5 - 5.1 mmol/L   Chloride 111 98 - 111 mmol/L    CO2 20 (L) 22 - 32 mmol/L   Glucose, Bld 204 (H) 70 - 99 mg/dL   BUN 17 8 - 23 mg/dL   Creatinine, Ser 1.20 (H) 0.44 - 1.00 mg/dL   Calcium 9.4 8.9 - 10.3 mg/dL   Total Protein 7.3 6.5 - 8.1 g/dL   Albumin 3.7 3.5 - 5.0 g/dL   AST 21 15 - 41 U/L   ALT 29 0 - 44 U/L   Alkaline Phosphatase 198 (H) 38 - 126 U/L   Total Bilirubin 0.5 0.3 - 1.2 mg/dL   GFR calc non Af Amer 47 (L) >60 mL/min   GFR calc Af Amer 54 (L) >60 mL/min   Anion gap 13 5 - 15    Comment: Performed at [facility], [address] [place], [place] [address]  CBC WITH DIFFERENTIAL     Status: Abnormal   Collection Time: 10/30/18  2:18 PM  Result Value Ref Range   WBC 12.8 (H) 4.0 - 10.5 K/uL   RBC 3.58 (L) 3.87 - 5.11 MIL/uL   Hemoglobin 10.8 (L) 12.0 - 15.0 g/dL   HCT 34.6 (L) 36.0 - 46.0 %   MCV 96.6 80.0 - 100.0 fL   MCH 30.2 26.0 - 34.0 pg  MCHC 31.2 30.0 - 36.0 g/dL   RDW 15.8 (H) 11.5 - 15.5 %   Platelets 303 150 - 400 K/uL   nRBC 0.0 0.0 - 0.2 %   Neutrophils Relative % 72 %   Neutro Abs 9.2 (H) 1.7 - 7.7 K/uL   Lymphocytes Relative 19 %   Lymphs Abs 2.4 0.7 - 4.0 K/uL   Monocytes Relative 8 %   Monocytes Absolute 1.0 0.1 - 1.0 K/uL   Eosinophils Relative 0 %   Eosinophils Absolute 0.0 0.0 - 0.5 K/uL   Basophils Relative 0 %   Basophils Absolute 0.0 0.0 - 0.1 K/uL   Immature Granulocytes 1 %   Abs Immature Granulocytes 0.07 0.00 - 0.07 K/uL    Comment: Performed at [facility], [address] [place], [place] [address]  Blood gas, venous     Status: Abnormal   Collection Time: 10/30/18  2:36 PM  Result Value Ref Range   FIO2 0.28    Delivery systems NASAL CANNULA    pH, Ven 7.30 7.250 - 7.430   pCO2, Ven 43 (L) 44.0 - 60.0 mmHg   pO2, Ven <31.0 (LL) 32.0 - 45.0 mmHg    Comment: CRITICAL RESULT CALLED TO, READ BACK BY AND VERIFIED WITH: YESSICA RN AT 1444 ON 10/30/18 BL    Bicarbonate 21.2 20.0 - 28.0 mmol/L   Acid-base deficit 5.1 (H) 0.0 - 2.0 mmol/L   O2  Saturation 23.5 %   Patient temperature 37.0    Collection site VENOUS    Sample type VENOUS     Comment: Performed at [facility], [address] [place], [place] [address]  MRSA PCR Screening     Status: None   Collection Time: 10/30/18  4:35 PM  Result Value Ref Range   MRSA by PCR NEGATIVE NEGATIVE    Comment:        The GeneXpert MRSA Assay (FDA approved for NASAL specimens only), is one component of a comprehensive MRSA colonization surveillance program. It is not intended to diagnose MRSA infection nor to guide or monitor treatment for MRSA infections. Performed at [facility], [address] [place], [place] [address]    Dg Chest 2 View  Result Date: 10/30/2018 CLINICAL DATA:  Tachycardia, fever and decreased oxygen saturation. EXAM: CHEST - 2 VIEW COMPARISON:  08/27/2013 FINDINGS: Cardiomediastinal silhouette is normal. Mediastinal contours appear intact. Tortuosity of the aorta. There is no evidence pleural effusion or pneumothorax. Peribronchial thickening with central predominance, and bilateral lower lobe peribronchial airspace consolidation. Osseous structures are without acute abnormality. Soft tissues are grossly normal. IMPRESSION: Peribronchial thickening with central predominance, and bilateral lower lobe peribronchial airspace consolidation. Findings may represent acute bronchitis with atelectasis or developing bronchopneumonia. Electronically Signed   By: Fidela Salisbury M.D.   On: 10/30/2018 15:33    Pending Labs Unresulted Labs (From admission, onward)    Start     Ordered   10/30/18 1429  Blood Culture (routine x 2)  BLOOD CULTURE X 2,   STAT     10/30/18 1429   10/30/18 1429  Urinalysis, Routine w reflex microscopic  ONCE - STAT,   STAT     10/30/18 1429   Signed and Held  HIV antibody (Routine Testing)  Once,   R     Signed and Held   Signed and Held  Basic metabolic panel  Tomorrow morning,   R     Signed and Held   Signed  and Held  CBC  Tomorrow morning,   R     Signed and Held   Signed and Held  Creatinine, serum  (enoxaparin (LOVENOX)    CrCl >/= 30 ml/min)  Weekly,   R    Comments:  while on enoxaparin therapy    Signed and Held   Signed and Held  Procalcitonin  ONCE - STAT,   R     Signed and Held   Signed and Held  Protime-INR  ONCE - STAT,   R     Signed and Held   Signed and Held  APTT  ONCE - STAT,   R     Signed and Held          Vitals/Pain Today's Vitals   10/30/18 2330 10/31/18 0000 10/31/18 0030 10/31/18 0100  BP: (!) 147/81 (!) 149/76 137/75 136/73  Pulse: (!) 114 (!) 112 (!) 107 (!) 108  Resp: 15 17 17 15   Temp:      TempSrc:      SpO2: 96% 95% 94% 96%  Weight:      Height:      PainSc:        Isolation Precautions No active isolations  Medications Medications  ceFEPIme (MAXIPIME) 2 g in sodium chloride 0.9 % 100 mL IVPB (has no administration in time range)  vancomycin (VANCOCIN) IVPB 750 mg/150 ml premix (has no administration in time range)  sodium chloride 0.9 % bolus 500 mL (500 mLs Intravenous Bolus 10/30/18 1439)  albuterol (PROVENTIL) (2.5 MG/3ML) 0.083% nebulizer solution 2.5 mg (2.5 mg Nebulization Given 10/30/18 1527)  vancomycin (VANCOCIN) IVPB 1000 mg/200 mL premix ( Intravenous Stopped 10/30/18 1737)  ceFEPIme (MAXIPIME) 2 g in sodium chloride 0.9 % 100 mL IVPB ( Intravenous Stopped 10/30/18 1623)    Mobility Walks with assitance

## 2018-10-31 NOTE — Progress Notes (Signed)
[place] [facility] [place] NAME: Tasha Howard    MR#:  [id number]  DATE OF BIRTH:  1951/01/15  SUBJECTIVE:  CHIEF COMPLAINT:   Chief Complaint  Patient presents with  . Weakness   -Patient is alert but not very interactive, able to answer some simple questions but kind of lethargic  REVIEW OF SYSTEMS:  Review of Systems  Unable to perform ROS: Mental status change    DRUG ALLERGIES:   Allergies  Allergen Reactions  . Metformin And Related Nausea Only    VITALS:  Blood pressure 114/76, pulse 88, temperature 98.8 F (37.1 C), temperature source Oral, resp. rate 17, height '5\' 6"'  (1.676 m), weight 49.2 kg, SpO2 95 %.  PHYSICAL EXAMINATION:  Physical Exam   GENERAL:  68 y.o.-year-old chronically ill appearing patient lying in the bed with no acute distress.  EYES: Pupils equal, round, reactive to light and accommodation. No scleral icterus. Extraocular muscles intact.  HEENT: Head atraumatic, normocephalic.  Head rotated to the right side, right facial droop noted.  Oropharynx and nasopharynx clear.  NECK:  Supple, no jugular venous distention. No thyroid enlargement, no tenderness.  LUNGS: Normal breath sounds bilaterally, no wheezing, rales,rhonchi or crepitation. No use of accessory muscles of respiration.  Decreased bibasilar breath sounds. CARDIOVASCULAR: S1, S2 normal. Norubs, or gallops.  2/6 systolic murmur is present ABDOMEN: Soft, nontender, nondistended. Bowel sounds present. No organomegaly or mass.  EXTREMITIES: No pedal edema, cyanosis, or clubbing.  NEUROLOGIC: Cranial nerves II through XII are intact.  Very slow speech.  Strength on the left upper extremity is 0/5, left lower extremity is 2/5, right upper and lower extremities is 2/5.  Overall weakness noted.  Sensation intact. Gait not checked.  PSYCHIATRIC: The patient is alert but oriented to self only SKIN: No obvious rash, lesion, or ulcer.    LABORATORY  PANEL:   CBC Recent Labs  Lab 10/31/18 0238  WBC 14.8*  HGB 10.0*  HCT 30.9*  PLT 259   ------------------------------------------------------------------------------------------------------------------  Chemistries  Recent Labs  Lab 10/30/18 1418 10/31/18 0238  NA 144 142  K 3.8 4.0  CL 111 113*  CO2 20* 18*  GLUCOSE 204* 230*  BUN 17 17  CREATININE 1.20* 1.10*  CALCIUM 9.4 8.9  AST 21  --   ALT 29  --   ALKPHOS 198*  --   BILITOT 0.5  --    ------------------------------------------------------------------------------------------------------------------  Cardiac Enzymes No results for input(s): TROPONINI in the last 168 hours. ------------------------------------------------------------------------------------------------------------------  RADIOLOGY:  Dg Chest 2 View  Result Date: 10/30/2018 CLINICAL DATA:  Tachycardia, fever and decreased oxygen saturation. EXAM: CHEST - 2 VIEW COMPARISON:  08/27/2013 FINDINGS: Cardiomediastinal silhouette is normal. Mediastinal contours appear intact. Tortuosity of the aorta. There is no evidence pleural effusion or pneumothorax. Peribronchial thickening with central predominance, and bilateral lower lobe peribronchial airspace consolidation. Osseous structures are without acute abnormality. Soft tissues are grossly normal. IMPRESSION: Peribronchial thickening with central predominance, and bilateral lower lobe peribronchial airspace consolidation. Findings may represent acute bronchitis with atelectasis or developing bronchopneumonia. Electronically Signed   By: Fidela Salisbury M.D.   On: 10/30/2018 15:33    EKG:   Orders placed or performed during the hospital encounter of 10/30/18  . EKG 12-Lead  . EKG 12-Lead  . ED EKG 12-Lead  . ED EKG 12-Lead    ASSESSMENT AND PLAN:   68 year old female with past medical history significant for multiple myeloma status post bone marrow transplantation  in 2016, on pomalidomide and  Decadron, cervical myelopathy with C3-6 stenosis, history of stroke in January 2020, history of right leg DVT, diabetes, neuropathy presents to hospital secondary to sepsis.  1.  Sepsis-secondary to healthcare acquired pneumonia.  MRSA PCR is negative -Follow blood cultures, procalcitonin is negative -Continue cefepime for now  2.  Acute encephalopathy-could be metabolic given her acute infection.  Avoid pain medications or sedatives -Alert and oriented at baseline. -We will get a CT of her head  3.  Recent CVA -with left-sided weakness. -Continue Eliquis and statin -Physical therapy consulted  4.  History of multiple myeloma-stage III with active reoccurrence.  Status post bone marrow transplantation in the past and chemotherapy.  Restarted chemotherapy.  Continue outpatient follow-up with oncology and hematology  5.  Diabetes mellitus-continue sliding scale insulin.  If intake improves, will restart home dose of Lantus  6.  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 -Decrease the rate of IV fluids  Overall decline in condition since her stroke. -Palliative care consult for goals of care discussion   All the records are reviewed and case discussed with Care Management/Social Workerr. Management plans discussed with the patient, family and they are in agreement.  CODE STATUS: Full code  TOTAL TIME TAKING CARE OF THIS PATIENT: 37 minutes.   POSSIBLE D/C IN 2-3 DAYS, DEPENDING ON CLINICAL CONDITION.   Gladstone Lighter M.D on 10/31/2018 at 10:46 AM  Between 7am to 6pm - Pager - [phone]  After 6pm go to [email or web address] - password [email or web address] [facility]  Office  [phone]  CC: Primary care physician; Patient, No Pcp Per

## 2018-10-31 NOTE — Consult Note (Signed)
[place] Nurse wound consult note Reason for Consult: Unstageable dry stable eschar to left heel on medial and central aspect.  Unstageable pressure injury to sacrum and left buttock in gluteal fold  Wound type:pressure injuries Pressure Injury POA: Yes Measurement: Sacrum:  1 cm x 0.5 cm slough to wound bed LEft gluteaL:  0.5 cm x 0.3 cm slough to wound bed.  Left central heel:  1 cm dry intact eschar Left medial hell:  1 cm x 0.5 cm intact eschar Wound MWU:[id number] tissue Drainage (amount, consistency, odor) minimal to sacral wounds. Heels are dry and intact Periwound:intact Dressing procedure/placement/frequency: Cleanse sacral wounds with NS and pat dry  Apply Santyl to wound bed. COver with NS moist 2x2.  Secure with dry gauze and foam dressing.  Reapply Santyl daily.  Change foam every three days and PRN soilage.  Will not follow at this time.  Please re-consult if needed.  Domenic Moras MSN, RN, FNP-BC CWON Wound, Ostomy, Continence Nurse Pager [phone]

## 2018-10-31 NOTE — Progress Notes (Signed)
Initial Nutrition Assessment  DOCUMENTATION CODES:   Severe malnutrition in context of chronic illness, Underweight  INTERVENTION: Provide Ensure Enlive po BID, each supplement provides 350 kcal and 20 grams of protein.   Provide daily MVI.  Continue vitamin C 500 mg BID and zinc sulfate 220 mg daily to promote would healing. These are home medications likely from a wound clinic. Long-term zinc supplementation can lead to copper deficiency, so long-term supplementation without copper supplementation should be done cautiously.  NUTRITION DIAGNOSIS:   Severe Malnutrition related to chronic illness(multiple myeloma, CHF) as evidenced by severe fat depletion, severe muscle depletion.  GOAL:   Patient will meet greater than or equal to 90% of their needs  MONITOR:   PO intake, Supplement acceptance, Labs, Weight trends, Skin, I & O's  REASON FOR ASSESSMENT:   Malnutrition Screening Tool, Consult Assessment of nutrition requirement/status  ASSESSMENT:   68 year old female with PMHx of DM, diabetic neuropathy, HTN, hx recent CVA with residual left-sided weakness, multiple myeloma s/p BMT now with recurrence on chemotherapy, CHF admitted with sepsis secondary to PNA.   -Pending PMT consult to discuss goals of care.  Attempted to meet with patient at bedside. She was very lethargic and unable to provide any history. No family members present at time of RD assessment. Per RN patient was too lethargic for PO intake at that time.  Very limited weight history in chart so unable to trend weight. Patient currently 49.2 kg (108.47 lbs).  Medications reviewed and include: Eliquis, Novolog 0-9 units TID, Remeron 15 mg QHS, thiamine 200 mg BID, vitamin C 500 mg BID, zinc sulfate 220 mg daily, NS @ 100 mL/hr, cefepime.  Labs reviewed: CBG 104-184, Chloride 113, CO2 18, Creatinine 1.1.  RN present in room during RD assessment.  NUTRITION - FOCUSED PHYSICAL EXAM:    Most Recent Value   Orbital Region  Severe depletion  Upper Arm Region  Severe depletion  Thoracic and Lumbar Region  Severe depletion  Buccal Region  Moderate depletion  Temple Region  Severe depletion  Clavicle Bone Region  Severe depletion  Clavicle and Acromion Bone Region  Severe depletion  Scapular Bone Region  Severe depletion  Dorsal Hand  Severe depletion  Patellar Region  Severe depletion  Anterior Thigh Region  Severe depletion  Posterior Calf Region  Severe depletion  Edema (RD Assessment)  None  Hair  Reviewed  Eyes  Unable to assess  Mouth  Unable to assess  Skin  Reviewed  Nails  Reviewed     Diet Order:   Diet Order            Diet regular Room service appropriate? Yes; Fluid consistency: Thin  Diet effective now             EDUCATION NEEDS:   Not appropriate for education at this time  Skin:  Skin Assessment: Skin Integrity Issues:(stage III sacrum (3cm x 1.5cm); unstageable right buttocks (1.5cm x 1.5cm))  Last BM:  Unknown/PTA  Height:   Ht Readings from Last 1 Encounters:  10/31/18 '5\' 6"'  (1.676 m)   Weight:   Wt Readings from Last 1 Encounters:  10/31/18 49.2 kg   Ideal Body Weight:  59.1 kg  BMI:  Body mass index is 17.51 kg/m.  Estimated Nutritional Needs:   Kcal:  1300-1500  Protein:  65-75 grams  Fluid:  1.3-1.5 L/day  Willey Blade, MS, RD, LDN Office: [phone] Pager: [phone] After Hours/Weekend Pager: [phone]

## 2018-10-31 NOTE — ED Notes (Signed)
Attempted to call report to floor, RN wants to call me back

## 2018-10-31 NOTE — Care Management Note (Addendum)
Case Management Note  Patient Details  Name: Tasha Howard MRN: [id number] Date of Birth: 1951-03-04  Subjective/Objective:  Admitted to [facility] with the diagnosis of sepsis. Lives at home. Daughter is Tasha Howard [phone]). Office visit with Dr. Reino Bellis 10/08/18 and Dr. Wynn Banker 10/11/18 at [facility] found in chart. Hx bone cancer.  Patient Outreach 10/2018.  Seen at [facility] for flu in the past.    Tasha Howard too sleepy to complete assessment.               Action/Plan: Followed by [facility] 10/18/18?  Message sent to [facility], [facility] representative.  Corene Cornea indicated that Tasha Howard needed more care than what could provide.  [employer] insurance per [facility].  Discharged from [facility] about a  week ago.   Expected Discharge Date:                  Expected Discharge Plan:     In-House Referral:   yes  Discharge planning Services     Post Acute Care Choice:    Choice offered to:     DME Arranged:    DME Agency:     HH Arranged:    HH Agency:     Status of Service:     If discussed at [employer], dates discussed:    Additional Comments:  Shelbie Ammons, RN MSN CCM Care Management [phone] 10/31/2018, 9:44 AM

## 2018-11-01 DIAGNOSIS — Z515 Encounter for palliative care: Secondary | ICD-10-CM

## 2018-11-01 DIAGNOSIS — Z8673 Personal history of transient ischemic attack (TIA), and cerebral infarction without residual deficits: Secondary | ICD-10-CM

## 2018-11-01 DIAGNOSIS — A419 Sepsis, unspecified organism: Principal | ICD-10-CM

## 2018-11-01 DIAGNOSIS — G8194 Hemiplegia, unspecified affecting left nondominant side: Secondary | ICD-10-CM

## 2018-11-01 DIAGNOSIS — Z7189 Other specified counseling: Secondary | ICD-10-CM

## 2018-11-01 LAB — GASTROINTESTINAL PANEL BY PCR, STOOL (REPLACES STOOL CULTURE)

## 2018-11-01 LAB — CBC
HCT: 24.8 % — ABNORMAL LOW (ref 36.0–46.0)
HCT: 26.1 % — ABNORMAL LOW (ref 36.0–46.0)
Hemoglobin: 7.8 g/dL — ABNORMAL LOW (ref 12.0–15.0)
Hemoglobin: 8.2 g/dL — ABNORMAL LOW (ref 12.0–15.0)
MCH: 30.2 pg (ref 26.0–34.0)
MCH: 30.5 pg (ref 26.0–34.0)
MCHC: 31.5 g/dL (ref 30.0–36.0)
MCHC: 31.4 g/dL (ref 30.0–36.0)
MCV: 96.1 fL (ref 80.0–100.0)
MCV: 97 fL (ref 80.0–100.0)
PLATELETS: 217 10*3/uL (ref 150–400)
PLATELETS: 218 10*3/uL (ref 150–400)
RBC: 2.58 MIL/uL — ABNORMAL LOW (ref 3.87–5.11)
RBC: 2.69 MIL/uL — ABNORMAL LOW (ref 3.87–5.11)
RDW: 15.7 % — ABNORMAL HIGH (ref 11.5–15.5)
RDW: 16 % — ABNORMAL HIGH (ref 11.5–15.5)
WBC: 11.9 10*3/uL — ABNORMAL HIGH (ref 4.0–10.5)
WBC: 10.8 10*3/uL — AB (ref 4.0–10.5)
nRBC: 0 % (ref 0.0–0.2)
nRBC: 0 % (ref 0.0–0.2)

## 2018-11-01 LAB — GLUCOSE, CAPILLARY
Glucose-Capillary: 199 mg/dL — ABNORMAL HIGH (ref 70–99)
Glucose-Capillary: 180 mg/dL — ABNORMAL HIGH (ref 70–99)
Glucose-Capillary: 258 mg/dL — ABNORMAL HIGH (ref 70–99)
Glucose-Capillary: 176 mg/dL — ABNORMAL HIGH (ref 70–99)

## 2018-11-01 LAB — HEPATIC FUNCTION PANEL
ALT: 17 U/L (ref 0–44)
AST: 16 U/L (ref 15–41)
Albumin: 2.6 g/dL — ABNORMAL LOW (ref 3.5–5.0)
Alkaline Phosphatase: 135 U/L — ABNORMAL HIGH (ref 38–126)
Bilirubin, Direct: <0.1 mg/dL (ref 0.0–0.2)
Total Bilirubin: 0.5 mg/dL (ref 0.3–1.2)
Total Protein: 5.4 g/dL — ABNORMAL LOW (ref 6.5–8.1)

## 2018-11-01 LAB — BASIC METABOLIC PANEL
Anion gap: 7 (ref 5–15)
BUN: 14 mg/dL (ref 8–23)
CO2: 18 mmol/L — ABNORMAL LOW (ref 22–32)
Calcium: 8.2 mg/dL — ABNORMAL LOW (ref 8.9–10.3)
Chloride: 117 mmol/L — ABNORMAL HIGH (ref 98–111)
Creatinine, Ser: 0.9 mg/dL (ref 0.44–1.00)
GFR calc Af Amer: >60 mL/min (ref >60)
GFR calc non Af Amer: >60 mL/min (ref >60)
Glucose, Bld: 233 mg/dL — ABNORMAL HIGH (ref 70–99)
Potassium: 3.3 mmol/L — ABNORMAL LOW (ref 3.5–5.1)
Sodium: 142 mmol/L (ref 135–145)

## 2018-11-01 LAB — HIV ANTIBODY (ROUTINE TESTING W REFLEX): HIV Screen 4th Generation wRfx: NONREACTIVE

## 2018-11-01 LAB — C DIFFICILE QUICK SCREEN W PCR REFLEX
C Diff antigen: NEGATIVE
C Diff interpretation: NOT DETECTED
C Diff toxin: NEGATIVE

## 2018-11-01 MED ORDER — LOPERAMIDE HCL 2 MG PO CAPS
2.0000 mg | ORAL_CAPSULE | ORAL | Status: DC | PRN
  Administered 2018-11-01 (×2): 2 mg via ORAL
  Filled 2018-11-01 (×2): qty 1

## 2018-11-01 MED ORDER — SODIUM CHLORIDE 0.9 % IV SOLN
INTRAVENOUS | Status: DC | PRN
  Administered 2018-11-01 – 2018-11-02 (×4): 500 mL via INTRAVENOUS

## 2018-11-01 MED ORDER — APIXABAN 2.5 MG PO TABS
2.5000 mg | ORAL_TABLET | Freq: Two times a day (BID) | ORAL | Status: DC
  Administered 2018-11-02: 2.5 mg via ORAL
  Filled 2018-11-01: qty 1

## 2018-11-01 MED ORDER — INSULIN GLARGINE 100 UNIT/ML ~~LOC~~ SOLN
5.0000 [IU] | Freq: Every day | SUBCUTANEOUS | Status: DC
  Administered 2018-11-01: 5 [IU] via SUBCUTANEOUS
  Filled 2018-11-01 (×2): qty 0.05

## 2018-11-01 MED ORDER — POTASSIUM CHLORIDE 10 MEQ/100ML IV SOLN
10.0000 meq | INTRAVENOUS | Status: AC
  Administered 2018-11-01 (×4): 10 meq via INTRAVENOUS
  Filled 2018-11-01 (×3): qty 100

## 2018-11-01 NOTE — Progress Notes (Signed)
PT Cancellation Note  Patient Details Name: Tasha Howard MRN: [id number] DOB: 01/25/1951   Cancelled Treatment:    Reason Eval/Treat Not Completed: Fatigue/lethargy limiting ability to participate(Evaluation attempted, pt engaged supine in bed. ) Pt is drowsy, dysarthric and hypophonic, difficult to understand at times. Pt responds verbally to questioning <50% of the time, and responses take generally >10seconds. Pt does better answering simple questions, but unable to answer more complex ones about home set-up. Pt following commands for RUE testing, but profoundly weak at this time, questionably due to lethargy. Family or caregivers available to establish baseline would be helpful. Pt reports she lives at home with son/DTR [name] and [name], and was using a WC for mobility.   8:41 AM, 11/01/18 Etta Grandchild, PT, DPT Physical Therapist - [facility]  [phone] ([place])    Buccola,[name] 11/01/2018, 8:37 AM

## 2018-11-01 NOTE — Progress Notes (Signed)
[facility] NAME: Tasha Howard    MR#:  [id number]  DATE OF BIRTH:  01-Oct-1950  SUBJECTIVE:  CHIEF COMPLAINT:   Chief Complaint  Patient presents with  . Weakness   -Patient opens eyes to name and nods head to yes or no questions.  Able to lift her right arm when asked but not following other commands.  Did not speak any this morning  REVIEW OF SYSTEMS:  Review of Systems  Unable to perform ROS: Mental status change    DRUG ALLERGIES:   Allergies  Allergen Reactions  . Metformin And Related Nausea Only    VITALS:  Blood pressure 134/70, pulse (!) 101, temperature 99.6 F (37.6 C), resp. rate 16, height '5\' 6"'  (1.676 m), weight 49.2 kg, SpO2 90 %.  PHYSICAL EXAMINATION:  Physical Exam   GENERAL:  68 y.o.-year-old chronically ill appearing patient lying in the bed with no acute distress.  EYES: Pupils equal, round, reactive to light and accommodation. No scleral icterus. Extraocular muscles intact.  HEENT: Head atraumatic, normocephalic.  Head rotated to the right side, left facial droop noted.  Oropharynx and nasopharynx clear.  NECK:  Supple, no jugular venous distention. No thyroid enlargement, no tenderness.  LUNGS: Normal breath sounds bilaterally, no wheezing, rales,rhonchi or crepitation. No use of accessory muscles of respiration.  Decreased bibasilar breath sounds. CARDIOVASCULAR: S1, S2 normal. Norubs, or gallops.  2/6 systolic murmur is present ABDOMEN: Soft, nontender, nondistended. Bowel sounds present. No organomegaly or mass.  EXTREMITIES: No pedal edema, cyanosis, or clubbing.  NEUROLOGIC: Cranial nerves II through XII are intact.  Very slow speech.  Strength on the left upper extremity is 0/5, left lower extremity is 1/5, right upper and lower extremities is 3/5.  Overall weakness noted.  Sensation intact. Gait not checked.  PSYCHIATRIC: The patient is alert but oriented to self only SKIN: No  obvious rash, lesion, or ulcer.    LABORATORY PANEL:   CBC Recent Labs  Lab 11/01/18 0544  WBC 11.9*  HGB 7.8*  HCT 24.8*  PLT 217   ------------------------------------------------------------------------------------------------------------------  Chemistries  Recent Labs  Lab 11/01/18 0544  NA 142  K 3.3*  CL 117*  CO2 18*  GLUCOSE 233*  BUN 14  CREATININE 0.90  CALCIUM 8.2*  AST 16  ALT 17  ALKPHOS 135*  BILITOT 0.5   ------------------------------------------------------------------------------------------------------------------  Cardiac Enzymes No results for input(s): TROPONINI in the last 168 hours. ------------------------------------------------------------------------------------------------------------------  RADIOLOGY:  Dg Chest 2 View  Result Date: 10/30/2018 CLINICAL DATA:  Tachycardia, fever and decreased oxygen saturation. EXAM: CHEST - 2 VIEW COMPARISON:  08/27/2013 FINDINGS: Cardiomediastinal silhouette is normal. Mediastinal contours appear intact. Tortuosity of the aorta. There is no evidence pleural effusion or pneumothorax. Peribronchial thickening with central predominance, and bilateral lower lobe peribronchial airspace consolidation. Osseous structures are without acute abnormality. Soft tissues are grossly normal. IMPRESSION: Peribronchial thickening with central predominance, and bilateral lower lobe peribronchial airspace consolidation. Findings may represent acute bronchitis with atelectasis or developing bronchopneumonia. Electronically Signed   By: Fidela Salisbury M.D.   On: 10/30/2018 15:33   Ct Head Wo Contrast  Result Date: 10/31/2018 CLINICAL DATA:  Altered level of consciousness. EXAM: CT HEAD WITHOUT CONTRAST TECHNIQUE: Contiguous axial images were obtained from the base of the skull through the vertex without intravenous contrast. COMPARISON:  CT scan of October 19, 2018. FINDINGS: Brain: Subacute infarction is seen involving  the right frontal lobe with probable hemorrhage along  its posterior margin which may demonstrate slightly decreased edema compared to prior exam. No midline shift is noted. Ventricular size is within normal limits. No new infarction or hemorrhage is noted. Vascular: No hyperdense vessel or unexpected calcification. Skull: Normal. Negative for fracture or focal lesion. Sinuses/Orbits: No acute finding. Other: None. IMPRESSION: Right frontal lobe acute infarction is noted with stable possible hemorrhage along its periphery which demonstrates slightly decreased edema compared to prior exam. No significant change compared to prior exam. Electronically Signed   By: Marijo Conception, M.D.   On: 10/31/2018 13:44    EKG:   Orders placed or performed during the hospital encounter of 10/30/18  . EKG 12-Lead  . EKG 12-Lead  . ED EKG 12-Lead  . ED EKG 12-Lead    ASSESSMENT AND PLAN:   68 year old female with past medical history significant for multiple myeloma status post bone marrow transplantation in 2016, on pomalidomide and Decadron, cervical myelopathy with C3-6 stenosis, history of stroke in January 2020, history of right leg DVT, diabetes, neuropathy presents to hospital secondary to sepsis.  1.  Sepsis-secondary to healthcare acquired pneumonia.  MRSA PCR is negative -Negative blood cultures, procalcitonin is negative -Continue cefepime for now  2.  Acute encephalopathy-could be metabolic given her acute infection.  Avoid pain medications or sedatives -Recent 2 weeks ago had a right frontal lobe infarct and basal ganglia infarct with hemorrhage surrounding.  Sent to [facility] and discharged. -Overall declining since then.  Has significant left-sided hemiparesis, speech is unclear.  Poor oral intake -Alert currently and not oriented. -Palliative care meeting with family this morning  3.  Recent CVA -with left-sided weakness. -on Eliquis and statin -Physical therapy consulted-patient unable to  participate with therapy  4.  History of multiple myeloma-stage III with active reoccurrence.  Status post bone marrow transplantation in the past and chemotherapy.  Chemotherapy on hold given recent decline.  Continue outpatient follow-up with oncology and hematology  5.  Diabetes mellitus-continue sliding scale insulin.  If intake improves, will restart home dose of Lantus  6.  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 -Decrease the rate of IV fluids  Overall decline in condition since her stroke. -Palliative care consult for goals of care discussion-family meeting this morning   All the records are reviewed and case discussed with Care Management/Social Workerr. Management plans discussed with the patient, family and they are in agreement.  CODE STATUS: Full code  TOTAL TIME TAKING CARE OF THIS PATIENT: 34 minutes.   POSSIBLE D/C IN 2-3 DAYS, DEPENDING ON CLINICAL CONDITION.   Gladstone Lighter M.D on 11/01/2018 at 8:59 AM  Between 7am to 6pm - Pager - [phone]  After 6pm go to [email or web address] - password [email or web address] [facility]  Office  [phone]  CC: Primary care physician; Patient, No Pcp Per

## 2018-11-01 NOTE — Consult Note (Signed)
Pharmacy Antibiotic Note  Tasha Howard is a 68 y.o. female admitted on 10/30/2018 with pneumonia/sepsis  Pharmacy has been consulted for cefepime dosing.  Plan: Day 3 IV Abx. Continue Cefepime 2g q12h  Height: 5\' 6"  (167.6 cm) Weight: 108 lb 7.5 oz (49.2 kg) IBW/kg (Calculated) : 59.3  Temp (24hrs), Avg:98.7 F (37.1 C), Min:97.9 F (36.6 C), Max:99.6 F (37.6 C)  Recent Labs  Lab 10/30/18 1418 10/31/18 0238 11/01/18 0544  WBC 12.8* 14.8* 11.9*  CREATININE 1.20* 1.10* 0.90  LATICACIDVEN 1.2  --   --     Estimated Creatinine Clearance: 47.1 mL/min (by C-G formula based on SCr of 0.9 mg/dL).    Allergies  Allergen Reactions  . Metformin And Related Nausea Only    Antimicrobials this admission: Vancomycin 2/26 >> 2/26  Cefepime 2/26 >>   Dose adjustments this admission: N/A  Microbiology results: 2/26 BCx: NG TD 2/26 MRSA PCR: negative   Thank you for allowing pharmacy to be a part of this patient's care.  Pernell Dupre, PharmD, BCPS Clinical Pharmacist 11/01/2018 9:32 AM

## 2018-11-01 NOTE — Evaluation (Signed)
Clinical/Bedside Swallow Evaluation Patient Details  Name: Tasha Howard MRN: [id number] Date of Birth: Mar 29, 1951  Today's Date: 11/01/2018 Time: SLP Start Time (ACUTE ONLY): 1100 SLP Stop Time (ACUTE ONLY): 1200 SLP Time Calculation (min) (ACUTE ONLY): 60 min  Past Medical History:  Past Medical History:  Diagnosis Date  . Bone cancer ([place])   . Cancer ([place])   . Diabetic neuropathy ([place])   . DM (diabetes mellitus) ([place])   . DVT (deep vein thrombosis) in pregnancy   . Hypertension   . Stroke [facility])    Past Surgical History:  Past Surgical History:  Procedure Laterality Date  . ABDOMINAL HYSTERECTOMY     HPI:  Tasha Howard is a 68 y.o. female  with past medical history of multiple myeloma (s/p bone marrow transplant w/ recurrence, was being treated- then treatment held due to stroke), ischemic stroke 09/14/18 with evolution 10/19/18 affecting the Right frontal lobe with residual L hemiplegia and cognitive deficits admitted on 10/30/2018 with sepsis related to pneumonia.    Assessment / Plan / Recommendation Clinical Impression  Tasha Howard appears to present w/ functional oropharyngeal phase swallowing w/ no immediate, overt s/s of aspiration noted w/ po trials. However, Tasha Howard exhibited an inconsistent, delayed throat clearing and cough post trials of liquids via cup/straw - Tasha Howard does have congested coughing at her baseline suspect d/t Peribronchial thickening with central predominance, and bilateral lower lobe peribronchial airspace consolidation(CXR); this coughing was noted Prior To po's during exertion, talking. Oral phase was adequate in bolus management w/ liquids and purees; she declined solids. Tasha Howard is alos Edentulous impacts effective mastication of solids. Educated Tasha Howard and family on strict aspiration precautions and monitoring for any s/s of aspiration w/ oral intake, especially when drinking thin liquids. Discussed aspiration precautions, and possible need for f/u w/ MBSS to objectively assess  Tasha Howard's swallow function on Monday. Recommended a more cut/chopped diet d/t Tasha Howard's Edentulous status; Pills given in PUREE for safer swallowing. NSG updated. Tasha Howard/family agreed.  SLP Visit Diagnosis: Dysphagia, oropharyngeal phase (R13.12)(Edentulous)    Aspiration Risk  Mild aspiration risk;Risk for inadequate nutrition/hydration    Diet Recommendation  Dysphagia level 3 (mech soft w/ well-cut meats, moistened foods); thin liquids. Strict aspiration precautions; support w/ tray setup at meals, positioning.  Medication Administration: Whole meds with puree(for safer swallowing)    Other  Recommendations Recommended Consults: (Dietician; Palliative Care following) Oral Care Recommendations: Oral care BID;Staff/trained caregiver to provide oral care(unable to use LUE) Other Recommendations: (n/a at this time)   Follow up Recommendations Home health SLP(TBD)      Frequency and Duration min 3x week  2 weeks       Prognosis Prognosis for Safe Diet Advancement: Fair Barriers to Reach Goals: Time post onset;Severity of deficits      Swallow Study   General Date of Onset: 10/30/18 HPI: Tasha Howard is a 68 y.o. female  with past medical history of multiple myeloma (s/p bone marrow transplant w/ recurrence, was being treated- then treatment held due to stroke), ischemic stroke 09/14/18 with evolution 10/19/18 affecting the Right frontal lobe with residual L hemiplegia and cognitive deficits admitted on 10/30/2018 with sepsis related to pneumonia.  Type of Study: Bedside Swallow Evaluation Previous Swallow Assessment: during admission at [facility] post stroke; at [facility] (per Tasha Howard/family report) Diet Prior to this Study: Regular;Thin liquids(per MD order at admission) Temperature Spikes Noted: No(wbc 10.8 declining) Respiratory Status: Nasal cannula(1-3 liters) History of Recent Intubation: No Behavior/Cognition: Alert;Cooperative;Pleasant mood;Distractible;Requires cueing(R frontal CVA) Oral Cavity Assessment:  Within Functional Limits Oral Care Completed by SLP: Recent completion by staff Oral Cavity - Dentition: Edentulous(does not wear dentures) Vision: Functional for self-feeding Self-Feeding Abilities: Able to feed self;Needs assist;Needs set up(unable to use LUE) Patient Positioning: Upright in bed(needed positioning) Baseline Vocal Quality: Normal;Low vocal intensity(slight-min dysarthria) Volitional Cough: Strong;Congested Volitional Swallow: Able to elicit    Oral/Motor/Sensory Function Overall Oral Motor/Sensory Function: Mild impairment(+) Facial ROM: Reduced left Facial Symmetry: Abnormal symmetry left Facial Strength: Reduced left Lingual ROM: Reduced left(slight) Lingual Symmetry: Abnormal symmetry left(slight) Lingual Strength: Within Functional Limits(grossly) Velum: Within Functional Limits Mandible: Within Functional Limits   Ice Chips Ice chips: Within functional limits Presentation: Spoon(fed; 3 trials)   Thin Liquid Thin Liquid: Impaired Presentation: Cup;Self Fed;Straw(3 trials via cup; 4 trials via straw) Oral Phase Impairments: (none) Oral Phase Functional Implications: (none) Pharyngeal  Phase Impairments: Throat Clearing - Delayed;Cough - Delayed(x1 each) Other Comments: unsure if related to the drinking d/t baseline congestion; exertion    Nectar Thick Nectar Thick Liquid: Not tested   Honey Thick Honey Thick Liquid: Not tested   Puree Puree: Within functional limits Presentation: Self Fed;Spoon(5 trials) Other Comments: declined further   Solid     Solid: Not tested Other Comments: declined at the time       Orinda Kenner, MS, CCC-SLP Tenisha Fleece 11/01/2018,4:28 PM

## 2018-11-01 NOTE — Progress Notes (Signed)
Labs indicate a drop in hemoglobin from 10 to7.8 -No active bleeding noted. -Hold Eliquis this morning-repeat draw for CBC ordered

## 2018-11-01 NOTE — Progress Notes (Signed)
   11/01/18 1000  Clinical Encounter Type  Visited With Patient  Visit Type Initial  Ch was rounding. Pt seemed slightly down but did not have the desire to talk. Ch left with a blessing.

## 2018-11-01 NOTE — Progress Notes (Addendum)
Hemoglobin is stable around 8.  Monitor for active bleeding -Restart Eliquis due to given recent stroke -Patient also having diarrhea this afternoon.  Started on a diet.  Check for C. difficile, if negative give imodium  Patient is very alert, oriented, left-sided weakness noted.  Patient sitting up and interacting.  Able to feed herself. -Updated daughter and palliative care services.  Plan is to continue to work with physical therapy and if improved-discharge home with home health and palliative care.

## 2018-11-01 NOTE — Consult Note (Signed)
Consultation Note Date: 11/01/2018   Patient Name: Tasha Howard  DOB: Mar 04, 1951  MRN: [id number]  Age / Sex: 68 y.o., female  PCP: Patient, No Pcp Per Referring Physician: Gladstone Lighter, MD  Reason for Consultation: Establishing goals of care  HPI/Patient Profile: 68 y.o. female  with past medical history of multiple myeloma (s/p bone marrow transplant w/ recurrence, was being treated- then treatment held due to stroke), ischemic stroke 09/14/18 with evolution 10/19/18 affecting the frontal lobe with residual L hemiplegia and cognitive deficits admitted on 10/30/2018 with sepsis related to pneumonia. Patient has been very lethargic. Palliative medicine consulted for [place].    Clinical Assessment and Goals of Care:  I have reviewed medical records including EPIC notes, labs and imaging,  assessed the patient and then met at the bedside along with patient and her daughter [name] to discuss diagnosis prognosis, GOC, EOL wishes, disposition and options.  Patient was more awake than yesterday. Able to participate limitedly in conversation. At times she was very attentive, but then would drift away and lose focus. However, at one point [place] and I left the room and the patient was able to recall the conversation that was had prior to our leaving the room when we returned.  I introduced Palliative Medicine as specialized medical care for people living with serious illness. It focuses on providing relief from the symptoms and stress of a serious illness. The goal is to improve quality of life for both the patient and the family.  We discussed a brief life review of the patient. She has five children. She is widowed. She has always worked hard to provide for her children. Her grandchildren bring her great joy.  As far as functional and nutritional status- she has had overwhelming significant decline since  her stroke in January. Prior to this admission she was not ambulatory. Her daughter would have to lift her from bed to wheelchair. She requires full assistance for all ADL's. [name] notes she stopped eating completely about one week ago. Chart review shows intake during hospitalization has been minimal. Her breakfast tray is at bedside and only bites are missing.   We discussed her current illness and what it means in the larger context of her on-going co-morbidities.  Natural disease trajectory and expectations at EOL were discussed.   Advanced directives, concepts specific to code status, artifical feeding and hydration, and rehospitalization were considered and discussed. Patient requests DNR- states she does not want CPR, artificial feeding or ventilation. She does not want rehospitalization. She states she is at a place where she does not wish to continue to live in her debilitated state. [name] supports this and states she "knows her Mom is tired". [name] was always independent and the loss of this independence has been a great struggle for her. She states she would not want to reside in a facility. [name] states if she had an illness that would take her life she would choose to be comfortable and allow for natural dying process rather than  to treat it and prolong her life in her debilitated state. [name] notes that patient has been preparing for EOL- several days ago she requested that [place] call her estranged brother and sister so she could make peace with them.  The difference between aggressive medical intervention and comfort care was explained and discussed in light of the patient's goals of care.   Hospice and Palliative Care services outpatient were explained and offered.  Questions and concerns were addressed.  Hard Choices booklet left for review. The family was encouraged to call with questions or concerns.    Primary Decision Maker PATIENT-- and patient's daughterTorrie Mayers and other siblings    SUMMARY OF RECOMMENDATIONS -DNR -Patient verbalizing desire for comfort measures only. [name] to discuss with siblings and notify providers of final decision - may be home with hospice vs residential hospice    Code Status/Advance Care Planning:  DNR  Palliative Prophylaxis:   Frequent Pain Assessment and Turn Reposition  Additional Recommendations (Limitations, Scope, Preferences):  Minimize Medications and No Artificial Feeding  Prognosis:    Unable to determine- if transition to full comfort care- likely days- weeks  Discharge Planning: To Be Determined  Primary Diagnoses: Present on Admission: . Sepsis ([place])   I have reviewed the medical record, interviewed the patient and family, and examined the patient. The following aspects are pertinent.  Past Medical History:  Diagnosis Date  . Bone cancer ([place])   . Cancer ([place])   . Diabetic neuropathy ([place])   . DM (diabetes mellitus) ([place])   . DVT (deep vein thrombosis) in pregnancy   . Hypertension   . Stroke [facility])    Social History   Socioeconomic History  . Marital status: Divorced    Spouse name: Not on file  . Number of children: Not on file  . Years of education: Not on file  . Highest education level: Not on file  Occupational History  . Not on file  Social Needs  . Financial resource strain: Not on file  . Food insecurity:    Worry: Not on file    Inability: Not on file  . Transportation needs:    Medical: Not on file    Non-medical: Not on file  Tobacco Use  . Smoking status: Former Research scientist (life sciences)  . Smokeless tobacco: Never Used  Substance and Sexual Activity  . Alcohol use: Not Currently  . Drug use: Not Currently  . Sexual activity: Not Currently  Lifestyle  . Physical activity:    Days per week: Not on file    Minutes per session: Not on file  . Stress: Not on file  Relationships  . Social connections:    Talks on phone: Not on file    Gets together: Not on file      Attends religious service: Not on file    Active member of club or organization: Not on file    Attends meetings of clubs or organizations: Not on file    Relationship status: Not on file  Other Topics Concern  . Not on file  Social History Narrative  . Not on file   Family History  Problem Relation Age of Onset  . Cancer Mother   . Diabetes Mother   . Heart disease Mother   . Cancer Father    Scheduled Meds: . atorvastatin  80 mg Oral QPM  . chlorhexidine  15 mL Mouth Rinse BID  . collagenase   Topical Daily  . diclofenac sodium  2 g Topical  QID  . DULoxetine  60 mg Oral Daily  . feeding supplement (ENSURE ENLIVE)  237 mL Oral BID BM  . Influenza vac split quadrivalent PF  0.5 mL Intramuscular Tomorrow-1000  . insulin aspart  0-9 Units Subcutaneous TID WC  . insulin glargine  5 Units Subcutaneous QHS  . loratadine  10 mg Oral Daily  . losartan  12.5 mg Oral Daily  . mouth rinse  15 mL Mouth Rinse q12n4p  . metoprolol tartrate  12.5 mg Oral BID  . mirtazapine  15 mg Oral QHS  . multivitamin with minerals  1 tablet Oral Daily  . nicotine  14 mg Transdermal Daily  . pregabalin  75 mg Oral BID  . thiamine  200 mg Oral BID  . valACYclovir  500 mg Oral Daily  . vitamin C  500 mg Oral BID  . zinc sulfate  220 mg Oral Daily   Continuous Infusions: . sodium chloride 500 mL (11/01/18 0950)  . ceFEPime (MAXIPIME) IV 2 g (11/01/18 0951)  . potassium chloride 10 mEq (11/01/18 1127)   PRN Meds:.sodium chloride, acetaminophen **OR** acetaminophen, albuterol, bisacodyl, butalbital-acetaminophen-caffeine, guaiFENesin, HYDROcodone-acetaminophen, ketorolac, nitroGLYCERIN, ondansetron **OR** ondansetron (ZOFRAN) IV, senna-docusate Medications Prior to Admission:  Prior to Admission medications   Medication Sig Start Date End Date Taking? Authorizing Provider  acetaminophen (TYLENOL) 325 MG tablet Take 650 mg by mouth every 6 (six) hours as needed for pain. 09/25/18  Yes [provider]  albuterol (PROVENTIL HFA;VENTOLIN HFA) 108 (90 Base) MCG/ACT inhaler Inhale 2 puffs into the lungs every 6 (six) hours as needed for wheezing. 12/28/15  Yes [provider]  apixaban (ELIQUIS) 2.5 MG TABS tablet Take 2.5 mg by mouth 2 (two) times daily. 09/25/18  Yes [provider]  butalbital-acetaminophen-caffeine (FIORICET, ESGIC) 50-325-40 MG tablet Take 1 tablet by mouth every 6 (six) hours as needed for headache. 09/25/18  Yes [provider]  cetirizine (ZYRTEC) 10 MG tablet Take 10 mg by mouth daily. 03/28/17  Yes [provider]  diclofenac sodium (VOLTAREN) 1 % GEL Apply 2 g topically 4 (four) times daily. 02/08/18 02/08/19 Yes [provider]  DULoxetine (CYMBALTA) 60 MG capsule Take 60 mg by mouth daily. 09/26/18 10/30/18 Yes [provider]  furosemide (LASIX) 20 MG tablet Take 20 mg by mouth daily. 04/17/18  Yes [provider]  insulin glargine (LANTUS) 100 UNIT/ML injection Inject 6 Units into the skin Nightly. 09/25/18 10/30/18 Yes [provider]  insulin lispro (HUMALOG) 100 UNIT/ML injection Inject 10 Units into the skin 3 (three) times daily with meals.    Yes [provider]  losartan (COZAAR) 25 MG tablet Take 12.5 mg by mouth daily. 09/26/18 10/30/18 Yes [provider]  Melatonin 3 MG TABS Take 6 mg by mouth every evening. 09/25/18  Yes [provider]  mirtazapine (REMERON) 15 MG tablet Take 15 mg by mouth at bedtime.    Yes [provider]  Multiple Minerals (MULTI-MINERALS PO) Take 1 tablet by mouth daily. 09/26/18  Yes [provider]  nitroGLYCERIN (NITROLINGUAL) 0.4 MG/SPRAY spray Place 0.4 mg under the tongue every 5 (five) minutes x 3 doses as needed for chest pain.  07/12/18 07/12/19 Yes [provider]  oxyCODONE-acetaminophen (PERCOCET) 7.5-325 MG tablet Take 1 tablet by mouth every 8 (eight) hours as needed for pain. 08/30/18  Yes [provider]  pregabalin (LYRICA) 75 MG capsule Take 75 mg by mouth 2 (two) times daily. 08/30/18 11/28/18 Yes [provider]  thiamine 100 MG tablet Take 200 mg by mouth 2 (two) times daily. 09/25/18 09/25/19 Yes [provider]  valACYclovir (VALTREX) 500 MG tablet Take 500 mg by mouth daily. 06/03/18  Yes [provider]  vitamin C (ASCORBIC ACID) 500 MG tablet Take 500 mg by mouth 2 (two) times daily.   Yes [provider]  zinc sulfate 220 (50 Zn) MG capsule Take 220 mg by mouth daily.   Yes [provider]   Allergies  Allergen Reactions  . Metformin And Related Nausea Only   Review of Systems  Constitutional: Positive for activity change, fatigue and unexpected weight change.  Respiratory: Positive for cough.   Psychiatric/Behavioral: Positive for decreased concentration.    Physical Exam Vitals signs and nursing note reviewed.  Constitutional:      Appearance: She is ill-appearing.     Comments: cachetic  Cardiovascular:     Rate and Rhythm: Normal rate.  Pulmonary:     Comments: Wet cough Skin:    General: Skin is dry.  Neurological:     Mental Status: She is oriented to person, place, and time.     Comments: Poor concentration  Psychiatric:        Mood and Affect: Mood normal.        Thought Content: Thought content normal.     Vital Signs: BP 134/70 (BP Location: Left Arm)   Pulse (!) 101   Temp 99.6 F (37.6 C)   Resp 16   Ht '5\' 6"'  (1.676 m)   Wt 49.2 kg   SpO2 90%   BMI 17.51 kg/m  Pain Scale: 0-10   Pain Score: 0-No pain   SpO2: SpO2: 90 % O2 Device:SpO2: 90 % O2 Flow Rate: .O2 Flow Rate (L/min): 1 L/min  IO: Intake/output summary:   Intake/Output Summary (Last 24 hours) at 11/01/2018 1154 Last data filed at 11/01/2018 0200 Gross per 24 hour  Intake 1818.92 ml  Output 750 ml  Net 1068.92 ml    LBM: Last BM Date: 10/31/18 Baseline Weight: Weight: 53 kg Most recent weight: Weight: 49.2 kg       Palliative Assessment/Data: PPS: 20%   Flowsheet Rows     Most Recent Value  Intake Tab  Referral Department  Hospitalist  Unit at Time of Referral  Med/Surg Unit  Palliative Care Primary Diagnosis  Sepsis/Infectious Disease  Date Notified  10/31/18  Palliative Care Type  New Palliative care  Reason for referral  Clarify Goals of Care  Date of Admission  10/30/18  # of days IP prior to Palliative referral  1  Clinical Assessment  Psychosocial & Spiritual Assessment  Palliative Care Outcomes      Thank you for this consult. Palliative medicine will continue to follow and assist as needed.   Time In: 1030 Time Out: 1145 Time Total: 75 minutes Greater than 50%  of this time was spent counseling and coordinating care related to the above assessment and plan.  Signed by: Mariana Kaufman, AGNP-C Palliative Medicine    Please contact Palliative Medicine Team phone at [phone] for questions and concerns.  For individual provider: See Shea Evans

## 2018-11-02 LAB — BASIC METABOLIC PANEL
Anion gap: 8 (ref 5–15)
BUN: 11 mg/dL (ref 8–23)
CO2: 19 mmol/L — ABNORMAL LOW (ref 22–32)
Calcium: 9 mg/dL (ref 8.9–10.3)
Chloride: 116 mmol/L — ABNORMAL HIGH (ref 98–111)
Creatinine, Ser: 0.93 mg/dL (ref 0.44–1.00)
GFR calc Af Amer: >60 mL/min (ref >60)
GFR calc non Af Amer: >60 mL/min (ref >60)
Glucose, Bld: 145 mg/dL — ABNORMAL HIGH (ref 70–99)
POTASSIUM: 3.6 mmol/L (ref 3.5–5.1)
Sodium: 143 mmol/L (ref 135–145)

## 2018-11-02 LAB — CBC
HEMATOCRIT: 28.8 % — AB (ref 36.0–46.0)
HEMOGLOBIN: 9.1 g/dL — AB (ref 12.0–15.0)
MCH: 30.3 pg (ref 26.0–34.0)
MCHC: 31.6 g/dL (ref 30.0–36.0)
MCV: 96 fL (ref 80.0–100.0)
Platelets: 227 10*3/uL (ref 150–400)
RBC: 3 MIL/uL — AB (ref 3.87–5.11)
RDW: 15.7 % — ABNORMAL HIGH (ref 11.5–15.5)
WBC: 8.9 10*3/uL (ref 4.0–10.5)
nRBC: 0 % (ref 0.0–0.2)

## 2018-11-02 LAB — GLUCOSE, CAPILLARY
Glucose-Capillary: 153 mg/dL — ABNORMAL HIGH (ref 70–99)
Glucose-Capillary: 158 mg/dL — ABNORMAL HIGH (ref 70–99)

## 2018-11-02 MED ORDER — GUAIFENESIN 100 MG/5ML PO SOLN: 5.0000 mL | ORAL | 0 refills | Status: AC | PRN

## 2018-11-02 MED ORDER — ATORVASTATIN CALCIUM 80 MG PO TABS: 80.0000 mg | ORAL_TABLET | Freq: Every evening | ORAL | 0 refills | Status: AC

## 2018-11-02 MED ORDER — COLLAGENASE 250 UNIT/GM EX OINT: TOPICAL_OINTMENT | Freq: Every day | CUTANEOUS | 0 refills | Status: AC

## 2018-11-02 MED ORDER — NICOTINE 14 MG/24HR TD PT24: 14.0000 mg | MEDICATED_PATCH | Freq: Every day | TRANSDERMAL | 0 refills | Status: AC

## 2018-11-02 MED ORDER — CEFDINIR 300 MG PO CAPS: 300.0000 mg | ORAL_CAPSULE | Freq: Two times a day (BID) | ORAL | 0 refills | Status: DC

## 2018-11-02 MED ORDER — METOPROLOL TARTRATE 25 MG PO TABS: 12.5000 mg | ORAL_TABLET | Freq: Two times a day (BID) | ORAL | 0 refills | Status: AC

## 2018-11-02 MED ORDER — ENSURE ENLIVE PO LIQD: 237.0000 mL | Freq: Two times a day (BID) | ORAL | 12 refills | Status: AC

## 2018-11-02 NOTE — Care Management Note (Addendum)
Case Management Note  Patient Details  Name: Tasha Howard MRN: [id number] Date of Birth: 1951/07/26  Subjective/Objective:   Patient to be discharged per MD order. Orders in place for home health services. CMS Medicare.gov Compare Post Acute Care list reviewed with patient and she has no preference. Referral placed with Malachy Mood at [place]. DME orders for wheelchair but patient already has one. Patient lives with her son and daughter who assist in her care. Patient wishes to have home health as well as outpatient palliative. MD aware. Palliative team has seen patient in house. Will send referral to [facility] [place]. Family to transport.                  Action/Plan:   Expected Discharge Date:  11/02/18               Expected Discharge Plan:     In-House Referral:     Discharge planning Services  CM Consult  Post Acute Care Choice:  Home Health Choice offered to:  Patient  DME Arranged:    DME Agency:     HH Arranged:  RN, PT, OT, Nurse's Aide, Social Work [employer] Agency:  [employer]  Status of Service:  Completed, signed off  If discussed at [employer], dates discussed:    Additional Comments:  Latanya Maudlin, RN 11/02/2018, 12:56 PM

## 2018-11-02 NOTE — Progress Notes (Signed)
Pt being discharged home, discharge instructions and prescriptions reviewed with pt and daughters, states understanding, pt with no complaints at discharge

## 2018-11-02 NOTE — Care Management (Cosign Needed)
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 A walker/cane will; not resolve the issue with performing activities of daily living. A wheelchair will allow patient to safely perform daily activities. Patient can safely propel the wheelchair in the home and they have a caregiver who can assist.

## 2018-11-02 NOTE — Discharge Summary (Signed)
[place] [facility] [place] NAME: Tasha Howard    MR#:  [id number]  DATE OF BIRTH:  1951/01/06  DATE OF ADMISSION:  10/30/2018 ADMITTING PHYSICIAN: Demetrios Loll, MD  DATE OF DISCHARGE: No discharge date for patient encounter.  PRIMARY CARE PHYSICIAN: Patient, No Pcp Per    ADMISSION DIAGNOSIS:  Sepsis with acute hypoxic respiratory failure without septic shock, due to unspecified organism ([place]) [A41.9, R65.20, J96.01]  DISCHARGE DIAGNOSIS:  Active Problems:   Sepsis ([place])   Pressure injury of skin   Protein-calorie malnutrition, severe   History of CVA (cerebrovascular accident)   Left hemiplegia ([place])   Advanced care planning/counseling discussion   Goals of care, counseling/discussion   Palliative care by specialist   SECONDARY DIAGNOSIS:   Past Medical History:  Diagnosis Date  . Bone cancer ([place])   . Cancer ([place])   . Diabetic neuropathy ([place])   . DM (diabetes mellitus) ([place])   . DVT (deep vein thrombosis) in pregnancy   . Hypertension   . Stroke [facility])     HOSPITAL COURSE:    68 year old female with past medical history significant for multiple myeloma status post bone marrow transplantation in 2016, on pomalidomide and Decadron, cervical myelopathy with C3-6 stenosis, history of stroke in January 2020, history of right leg DVT, diabetes, neuropathy presents to hospital secondary to sepsis.  *Sepsis secondary to healthcare acquired pneumonia Resolved Treated with empiric cefepime while in house MRSA PCR is negative Negative blood cultures, procalcitonin is negative  *Acute encephalopathy Resolved Suspected due to metabolic given her acute infection. Noted recent 2 weeks ago had a right frontal lobe infarct and basal ganglia infarct with hemorrhage surrounding.  Sent to [facility] and discharged. -Overall declining since then.  Has significant left-sided hemiparesis, speech is unclear.  Poor oral intake Alert  currently and not oriented. Palliative care did see patient while in house  *Recent CVA -with left-sided weakness. Continue Eliquis and statin Physical therapy did see patient while in house - patient unable to participate with therapy  *History of multiple myeloma-stage III with active reoccurrence Status post bone marrow transplantation in the past and chemotherapy. 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  Overall decline in condition since her stroke. Palliative care services status post discharge  DISCHARGE CONDITIONS:   stable  CONSULTS OBTAINED:    DRUG ALLERGIES:   Allergies  Allergen Reactions  . Metformin And Related Nausea Only    DISCHARGE MEDICATIONS:   Allergies as of 11/02/2018      Reactions   Metformin And Related Nausea Only      Medication List    TAKE these medications   acetaminophen 325 MG tablet Commonly known as:  TYLENOL Take 650 mg by mouth every 6 (six) hours as needed for pain.   albuterol 108 (90 Base) MCG/ACT inhaler Commonly known as:  PROVENTIL HFA;VENTOLIN HFA Inhale 2 puffs into the lungs every 6 (six) hours as needed for wheezing.   apixaban 2.5 MG Tabs tablet Commonly known as:  ELIQUIS Take 2.5 mg by mouth 2 (two) times daily.   atorvastatin 80 MG tablet Commonly known as:  LIPITOR Take 1 tablet (80 mg total) by mouth every evening for 30  days.   butalbital-acetaminophen-caffeine 50-325-40 MG tablet Commonly known as:  FIORICET, ESGIC Take 1 tablet by mouth every 6 (six) hours as needed for headache.   cefdinir 300 MG capsule Commonly known as:  OMNICEF Take 1 capsule (300 mg total) by mouth 2 (two) times  daily.   cetirizine 10 MG tablet Commonly known as:  ZYRTEC Take 10 mg by mouth daily.   collagenase ointment Commonly known as:  SANTYL Apply topically daily. To affected area Start taking on:  November 03, 2018   diclofenac sodium 1 % Gel Commonly known as:  VOLTAREN Apply 2 g topically 4 (four) times daily.   DULoxetine 60 MG capsule Commonly known as:  CYMBALTA Take 60 mg by mouth daily.   feeding supplement (ENSURE ENLIVE) Liqd Take 237 mLs by mouth 2 (two) times daily between meals.   furosemide 20 MG tablet Commonly known as:  LASIX Take 20 mg by mouth daily.   guaiFENesin 100 MG/5ML Soln Commonly known as:  ROBITUSSIN Take 5 mLs (100 mg total) by mouth every 4 (four) hours as needed for cough or to loosen phlegm.   insulin lispro 100 UNIT/ML injection Commonly known as:  HUMALOG Inject 10 Units into the skin 3 (three) times daily with meals.   LANTUS 100 UNIT/ML injection Generic drug:  insulin glargine Inject 6 Units into the skin Nightly.   losartan 25 MG tablet Commonly known as:  COZAAR Take 12.5 mg by mouth daily.   Melatonin 3 MG Tabs Take 6 mg by mouth every evening.   metoprolol tartrate 25 MG tablet Commonly known as:  LOPRESSOR Take 0.5 tablets (12.5 mg total) by mouth 2 (two) times daily.   mirtazapine 15 MG tablet Commonly known as:  REMERON Take 15 mg by mouth at bedtime.   MULTI-MINERALS PO Take 1 tablet by mouth daily.   nicotine 14 mg/24hr patch Commonly known as:  NICODERM CQ - dosed in mg/24 hours Place 1 patch (14 mg total) onto the skin daily. Start taking on:  November 03, 2018   nitroGLYCERIN 0.4 MG/SPRAY spray Commonly known as:  NITROLINGUAL Place 0.4 mg under the tongue every 5 (five) minutes x 3 doses as needed for chest pain.   oxyCODONE-acetaminophen 7.5-325 MG tablet Commonly known as:  PERCOCET Take 1 tablet by mouth every 8 (eight) hours as needed for pain.   pregabalin 75 MG capsule Commonly known as:  LYRICA Take  75 mg by mouth 2 (two) times daily.   thiamine 100 MG tablet Take 200 mg by mouth 2 (two) times daily.   valACYclovir 500 MG tablet Commonly known as:  VALTREX Take 500 mg by mouth daily.   vitamin C 500 MG tablet Commonly known as:  ASCORBIC ACID Take 500 mg by mouth 2 (two) times daily.   zinc sulfate 220 (50 Zn) MG capsule Take 220 mg by mouth daily.            Durable Medical Equipment  (From admission, onward)         Start     Ordered   11/02/18 1059  For home use only DME wheelchair cushion (seat and back)  Educational psychologist)  Once    Comments:  Regarding CVA, intracranial hemorrhage   11/02/18 1058           DISCHARGE INSTRUCTIONS:    If you experience worsening of your admission symptoms, develop shortness of breath, life threatening emergency, suicidal or homicidal thoughts you must seek medical attention immediately by calling 911  or calling your MD immediately  if symptoms less severe.  You Must read complete instructions/literature along with all the possible adverse reactions/side effects for all the Medicines you take and that have been prescribed to you. Take any new Medicines after you have completely understood and accept all the possible adverse reactions/side effects.   Please note  You were cared for by a hospitalist during your hospital stay. If you have any questions about your discharge medications or the care you received while you were in the hospital after you are discharged, you can call the unit and asked to speak with the hospitalist on call if the hospitalist that took care of you is not available. Once you are discharged, your primary care physician will handle any further medical issues. Please note that NO REFILLS for any discharge medications will be authorized once you are discharged, as it is imperative that you return to your primary care physician (or establish a relationship with a primary care physician if you do not have one) for your  aftercare needs so that they can reassess your need for medications and monitor your lab values.    Today   CHIEF COMPLAINT:   Chief Complaint  Patient presents with  . Weakness    HISTORY OF PRESENT ILLNESS:   68 y.o. female with a known history of hypertension, diabetes, stroke, DVT, bone cancer.  She recently had a influenza A and admitted to [facility] for evaluation of stroke.  The patient said she finished her flu medication.  She has had fever, chills, cough and generalized weakness for the past couple days.  She was discharged back to home from skilled nursing facility a week ago.  She is found sepsis with tachycardia and leukocytosis.  Chest x-ray report bronchopneumonia.  She is found hypoxia and put on oxygen by nasal cannula.  She is treated with cefepime and vancomycin in the ED.  VITAL SIGNS:  Blood pressure (!) 163/86, pulse 89, temperature 98.7 F (37.1 C), temperature source Oral, resp. rate 16, height _0  (1.676 m), weight 49.2 kg, SpO2 99 %.  I/O:    Intake/Output Summary (Last 24 hours) at 11/02/2018 1058 Last data filed at 11/01/2018 1529 Gross per 24 hour  Intake 500.58 ml  Output -  Net 500.58 ml    PHYSICAL EXAMINATION:  GENERAL:  68 y.o.-year-old patient lying in the bed with no acute distress.  EYES: Pupils equal, round, reactive to light and accommodation. No scleral icterus. Extraocular muscles intact.  HEENT: Head atraumatic, normocephalic. Oropharynx and nasopharynx clear.  NECK:  Supple, no jugular venous distention. No thyroid enlargement, no tenderness.  LUNGS: Normal breath sounds bilaterally, no wheezing, rales,rhonchi or crepitation. No use of accessory muscles of respiration.  CARDIOVASCULAR: S1, S2 normal. No murmurs, rubs, or gallops.  ABDOMEN: Soft, non-tender, non-distended. Bowel sounds present. No organomegaly or mass.  EXTREMITIES: No pedal edema, cyanosis, or clubbing.  NEUROLOGIC: Cranial nerves II through XII are intact. Muscle strength  5/5 in all extremities. Sensation intact. Gait not checked.  PSYCHIATRIC: The patient is alert and oriented x 3.  SKIN: No obvious rash, lesion, or ulcer.   DATA REVIEW:   CBC Recent Labs  Lab 11/02/18 0359  WBC 8.9  HGB 9.1*  HCT 28.8*  PLT 227    Chemistries  Recent Labs  Lab 11/01/18 0544 11/02/18 0359  NA 142 143  K 3.3* 3.6  CL 117* 116*  CO2 18* 19*  GLUCOSE 233* 145*  BUN 14 11  CREATININE 0.90 0.93  CALCIUM 8.2* 9.0  AST 16  --   ALT 17  --   ALKPHOS 135*  --   BILITOT 0.5  --     Cardiac Enzymes No results for input(s): TROPONINI in the last 168 hours.  Microbiology Results  Results for orders placed or performed during the hospital encounter of 10/30/18  Blood Culture (routine x 2)     Status: None (Preliminary result)   Collection Time: 10/30/18  2:18 PM  Result Value Ref Range Status   Specimen Description BLOOD RIGHT ANTECUBITAL  Final   Special Requests   Final    BOTTLES DRAWN AEROBIC AND ANAEROBIC Blood Culture results may not be optimal due to an excessive volume of blood received in culture bottles   Culture   Final    NO GROWTH 3 DAYS Performed at [facility], [address], [place], [place] [address]    Report Status PENDING  Incomplete  Blood Culture (routine x 2)     Status: None (Preliminary result)   Collection Time: 10/30/18  2:25 PM  Result Value Ref Range Status   Specimen Description BLOOD LEFT ANTECUBITAL  Final   Special Requests   Final    BOTTLES DRAWN AEROBIC AND ANAEROBIC Blood Culture results may not be optimal due to an excessive volume of blood received in culture bottles   Culture   Final    NO GROWTH 3 DAYS Performed at [facility], [address], [place], [place] [address]    Report Status PENDING  Incomplete  MRSA PCR Screening     Status: None   Collection Time: 10/30/18  4:35 PM  Result Value Ref Range Status   MRSA by PCR NEGATIVE NEGATIVE Final    Comment:        The GeneXpert MRSA  Assay (FDA approved for NASAL specimens only), is one component of a comprehensive MRSA colonization surveillance program. It is not intended to diagnose MRSA infection nor to guide or monitor treatment for MRSA infections. Performed at [facility], [address] [place], [place] [address]   C difficile quick scan w PCR reflex     Status: None   Collection Time: 11/01/18  2:54 PM  Result Value Ref Range Status   C Diff antigen NEGATIVE NEGATIVE Final   C Diff toxin NEGATIVE NEGATIVE Final   C Diff interpretation No C. difficile detected.  Final    Comment: Performed at [facility], [address] [place], [place] [address]  Gastrointestinal Panel by PCR , Stool     Status: None   Collection Time: 11/01/18  2:54 PM  Result Value Ref Range Status   Campylobacter species NOT DETECTED NOT DETECTED Final   Plesimonas shigelloides NOT DETECTED NOT DETECTED Final   Salmonella species NOT DETECTED NOT DETECTED Final   Yersinia enterocolitica NOT DETECTED NOT DETECTED Final   Vibrio species NOT DETECTED NOT DETECTED Final   Vibrio cholerae NOT DETECTED NOT DETECTED Final   Enteroaggregative E coli (EAEC) NOT DETECTED NOT DETECTED Final   Enteropathogenic E coli (EPEC) NOT DETECTED NOT DETECTED Final   Enterotoxigenic E coli (ETEC) NOT DETECTED NOT DETECTED Final   Shiga like toxin producing E coli (STEC) NOT DETECTED NOT DETECTED Final   Shigella/Enteroinvasive E coli (EIEC) NOT DETECTED NOT DETECTED Final   Cryptosporidium NOT DETECTED NOT DETECTED Final   Cyclospora cayetanensis NOT DETECTED NOT DETECTED Final   Entamoeba histolytica NOT DETECTED NOT DETECTED Final  Giardia lamblia NOT DETECTED NOT DETECTED Final   Adenovirus F40/41 NOT DETECTED NOT DETECTED Final   Astrovirus NOT DETECTED NOT DETECTED Final   Norovirus GI/GII NOT DETECTED NOT DETECTED Final   Rotavirus A NOT DETECTED NOT DETECTED Final   Sapovirus (I, II, IV, and V) NOT DETECTED NOT  DETECTED Final    Comment: Performed at [facility], [address] [place], [place] [address]    RADIOLOGY:  Ct Head Wo Contrast  Result Date: 10/31/2018 CLINICAL DATA:  Altered level of consciousness. EXAM: CT HEAD WITHOUT CONTRAST TECHNIQUE: Contiguous axial images were obtained from the base of the skull through the vertex without intravenous contrast. COMPARISON:  CT scan of October 19, 2018. FINDINGS: Brain: Subacute infarction is seen involving the right frontal lobe with probable hemorrhage along its posterior margin which may demonstrate slightly decreased edema compared to prior exam. No midline shift is noted. Ventricular size is within normal limits. No new infarction or hemorrhage is noted. Vascular: No hyperdense vessel or unexpected calcification. Skull: Normal. Negative for fracture or focal lesion. Sinuses/Orbits: No acute finding. Other: None. IMPRESSION: Right frontal lobe acute infarction is noted with stable possible hemorrhage along its periphery which demonstrates slightly decreased edema compared to prior exam. No significant change compared to prior exam. Electronically Signed   By: Marijo Conception, M.D.   On: 10/31/2018 13:44    EKG:   Orders placed or performed during the hospital encounter of 10/30/18  . EKG 12-Lead  . EKG 12-Lead  . ED EKG 12-Lead  . ED EKG 12-Lead      Management plans discussed with the patient, family and they are in agreement.  CODE STATUS:     Code Status Orders  (From admission, onward)         Start     Ordered   11/01/18 1158  Do not attempt resuscitation (DNR)  Continuous    Question Answer Comment  In the event of cardiac or respiratory ARREST Do not call a "code blue"   In the event of cardiac or respiratory ARREST Do not perform Intubation, CPR, defibrillation or ACLS   In the event of cardiac or respiratory ARREST Use medication by any route, position, wound care, and other measures to relive pain and  suffering. May use oxygen, suction and manual treatment of airway obstruction as needed for comfort.      11/01/18 1157        Code Status History    Date Active Date Inactive Code Status Order ID Comments User Context   10/31/2018 0207 11/01/2018 1157 Full Code [id number]  Demetrios Loll, MD Inpatient      TOTAL TIME TAKING CARE OF THIS PATIENT: 40 minutes.    Avel Peace Lailie Smead M.D on 11/02/2018 at 10:58 AM  Between 7am to 6pm - Pager - [phone]  After 6pm go to [email or web address] - password [email or web address] [facility]  Office  [phone]  CC: Primary care physician; Patient, No Pcp Per   Note: This dictation was prepared with Dragon dictation along with smaller phrase technology. Any transcriptional errors that result from this process are unintentional.

## 2018-11-02 NOTE — Evaluation (Signed)
Physical Therapy Evaluation Patient Details Name: Tasha Howard MRN: [id number] DOB: [birth year] Today's Date: 11/02/2018   History of Present Illness  68 yo female with onset of sepsis and bronchitis/PNA was admitted after being in SNF recently for L hemiparesis from R frontal lobe infarct with some hemorrhage.  Pt has been tachycardic and requiring O2, but currently off.  Pt has PMHx:  multiple myeloma, cognitive changes, PN, HTN, DVT, DM,    Clinical Impression  Pt was seen for mobility to the chair after working on controlling sitting posture bedside.  Pt requires lateral support to sit, and after getting UE's supported had to monitor for her to lean backward inattentively.  Pt is able to take sliding steps to the chair, and asked nursing to assist her this way for recovery of her balance.  Will follow her to work on strength of L side, balance in all postures and recovery of mobility to shorten her stay in SNF.      Follow Up Recommendations SNF    Equipment Recommendations  None recommended by PT    Recommendations for Other Services       Precautions / Restrictions Precautions Precautions: Fall(L hemiparesis) Precaution Comments: monitor O2 sats Restrictions Weight Bearing Restrictions: No      Mobility  Bed Mobility Overal bed mobility: Needs Assistance Bed Mobility: Supine to Sit     Supine to sit: Mod assist     General bed mobility comments: pt requires help for trunk and to steady side of bed with UE support  Transfers Overall transfer level: Needs assistance Equipment used: 1 person hand held assist Transfers: Sit to/from Stand;Stand Pivot Transfers Sit to Stand: Mod assist Stand pivot transfers: Mod assist       General transfer comment: mod assist to support UE's and mod to pivot with pt sliding steps to get to chair  Ambulation/Gait             General Gait Details: unable to walk x sliding transfer steps  Stairs             Wheelchair Mobility    Modified Rankin (Stroke Patients Only) Modified Rankin (Stroke Patients Only) Pre-Morbid Rankin Score: No symptoms Modified Rankin: Moderately severe disability     Balance Overall balance assessment: Needs assistance Sitting-balance support: Feet supported;Bilateral upper extremity supported Sitting balance-Leahy Scale: Fair Sitting balance - Comments: fair most of the time, requires supervision on side of bed   Standing balance support: Bilateral upper extremity supported;During functional activity Standing balance-Leahy Scale: Poor                               Pertinent Vitals/Pain Pain Assessment: No/denies pain    Home Living Family/patient expects to be discharged to:: Private residence Living Arrangements: Children Available Help at Discharge: Family;Available PRN/intermittently Type of Home: House Home Access: Level entry     Home Layout: One level Home Equipment: Walker - 4 wheels;Wheelchair - [employer];Shower seat Additional Comments: equipment list from pt    Prior Function Level of Independence: Independent with assistive device(s)         Comments: I on rollator when daughter at work     Hand Dominance   Dominant Hand: Right    Extremity/Trunk Assessment   Upper Extremity Assessment Upper Extremity Assessment: LUE deficits/detail LUE Deficits / Details: no active LLE movement noted    Lower Extremity Assessment Lower Extremity Assessment: LLE deficits/detail LLE  Deficits / Details: dense L hemiparesis with some emerging active movement LLE Coordination: decreased fine motor;decreased gross motor    Cervical / Trunk Assessment Cervical / Trunk Assessment: Normal  Communication   Communication: Expressive difficulties  Cognition Arousal/Alertness: Awake/alert Behavior During Therapy: Flat affect Overall Cognitive Status: Impaired/Different from baseline Area of Impairment: Following  commands;Safety/judgement;Awareness;Problem solving                       Following Commands: Follows one step commands inconsistently;Follows one step commands with increased time Safety/Judgement: Decreased awareness of safety;Decreased awareness of deficits Awareness: Intellectual Problem Solving: Slow processing;Decreased initiation;Difficulty sequencing;Requires verbal cues;Requires tactile cues General Comments: pt is untrustworthy sitting side of bed as she will lose focus and lean back      General Comments General comments (skin integrity, edema, etc.): pt requires assist to sit and reposition to sit at chair, lateral pillows and L ankle boot to support foot drop    Exercises     Assessment/Plan    PT Assessment Patient needs continued PT services  PT Problem List Decreased strength;Decreased range of motion;Decreased activity tolerance;Decreased balance;Decreased mobility;Decreased coordination;Decreased knowledge of use of DME;Decreased safety awareness;Cardiopulmonary status limiting activity       PT Treatment Interventions DME instruction;Gait training;Functional mobility training;Therapeutic activities;Therapeutic exercise;Balance training;Neuromuscular re-education;Patient/family education    PT Goals (Current goals can be found in the Care Plan section)  Acute Rehab PT Goals Patient Stated Goal: to get home with daughter PT Goal Formulation: With patient Time For Goal Achievement: 11/16/18 Potential to Achieve Goals: Fair    Frequency 7X/week   Barriers to discharge Inaccessible home environment;Decreased caregiver support home with no family to assist her    Co-evaluation               AM-PAC PT "6 Clicks" Mobility  Outcome Measure Help needed turning from your back to your side while in a flat bed without using bedrails?: A Lot Help needed moving from lying on your back to sitting on the side of a flat bed without using bedrails?: A  Lot Help needed moving to and from a bed to a chair (including a wheelchair)?: A Lot Help needed standing up from a chair using your arms (e.g., wheelchair or bedside chair)?: A Lot Help needed to walk in hospital room?: Total Help needed climbing 3-5 steps with a railing? : Total 6 Click Score: 10    End of Session Equipment Utilized During Treatment: Gait belt Activity Tolerance: Patient limited by fatigue;Treatment limited secondary to medical complications (Comment) Patient left: in chair;with call bell/phone within reach;with chair alarm set;with nursing/sitter in room Nurse Communication: Mobility status;Other (comment)(alarm pad for chair) PT Visit Diagnosis: Unsteadiness on feet (R26.81);Muscle weakness (generalized) (M62.81);Difficulty in walking, not elsewhere classified (R26.2);Hemiplegia and hemiparesis Hemiplegia - Right/Left: Left Hemiplegia - dominant/non-dominant: Non-dominant Hemiplegia - caused by: Nontraumatic intracerebral hemorrhage    Time: 1000-1025 PT Time Calculation (min) (ACUTE ONLY): 25 min   Charges:   PT Evaluation $PT Eval Moderate Complexity: 1 Mod PT Treatments $Therapeutic Exercise: 8-22 mins       Ramond Dial 11/02/2018, 11:54 AM  Mee Hives, PT MS Acute Rehab Dept. Number: [place] [facility] [place]

## 2018-11-04 LAB — CULTURE, BLOOD (ROUTINE X 2)
Culture: NO GROWTH
Culture: NO GROWTH

## 2018-11-15 ENCOUNTER — Other Ambulatory Visit: Payer: Self-pay

## 2018-11-15 ENCOUNTER — Other Ambulatory Visit: Payer: Medicare HMO | Admitting: Student

## 2018-11-15 ENCOUNTER — Telehealth: Payer: Self-pay | Admitting: Student

## 2018-11-15 DIAGNOSIS — Z515 Encounter for palliative care: Secondary | ICD-10-CM

## 2018-11-15 NOTE — Telephone Encounter (Signed)
NP spoke with Tretha Sciara at Dr. Jacky Kindle office to clarify patient's order for her Humalog. Current orders in the home state to give 10 units 3 times a day, while patient has been using per a sliding scale, but no sliding scale in the home or documented. Her daughter has been giving 3 units usually. Her blood sugars this week have ranged from 217-435mg /dL. She also did not have her lantus; daughter states she will pick up from pharmacy. Asked that clarified orders be called to patient's home or daughter Otila Kluver.

## 2018-11-15 NOTE — Progress Notes (Signed)
[place] Consult Note Telephone: [phone]  Fax: [phone]  PATIENT NAME: Tasha Howard DOB: [birth year] MRN: [id number]  PRIMARY CARE PROVIDER:  Dr. Ronie Spies PROVIDER:  Shelbie Hutching, MD [place], [place] [address]      ASSESSMENT: Tasha Howard is resting in bed upon arrival; daughter Otila Kluver and home health nurse Cinda Quest are present. Explained role of Palliative medicine. We discussed goals of care; Tasha Howard would like to continue therapy. She would like to remain in her home. We discussed symptom management. Sacral wound observed; yellow slough present, peri area intact, no odor present. Wound care provided by home health nurse. Daughter was instructed to give acetaminophen if patient has fever; reviewed instructions with max limit of acetaminophen due to patient receiving oxycodone-acetaminophen and past history of abnormal liver function panel. Most recent levels were normal. Education provided on insulin, getting lantus refilled. Dr. Angelica Chessman notified for clarification on humalog orders. Conversation of patient's wishes introduced; Tasha Howard states she would like to discuss on the next visit.       RECOMMENDATIONS and PLAN:  1. Code status: Full Code; this will be an ongoing discussion. 2. Medical goals of therapy: she will continue to receive home health SN, OT, PT, HHA services through [facility]. Palliative Medicine will monitor for changes, declines and provide symptom management as needed. 3. Symptom management: pain-continue oxycodone-acetaminophen 7.5-325mg  one tab every 8 hours prn. Hyperglycemia-daughter to get Lantus refilled; administer 6 units qhs; PCP notified with clarification for Humalog orders. Edema-continue furosemide 20mg  daily; elevate hand. 4. Discharge Planning: Tasha Howard will continue to reside at home with assistance of her children. 5. Emotional support:  discussed with Tasha Howard, daughter Otila Kluver and [facility] Nurse Carmela Rima. They are encouraged to call as needs arise.   Referral made to Palliative Medicine SW to assist with Medicaid application, meals on wheels.  Palliative Medicine to follow up in two weeks or sooner, if needed.  I spent 60 minutes providing this consultation,  from 9:15am to 10:15am. More than 50% of the time in this consultation was spent coordinating communication.   HISTORY OF PRESENT ILLNESS:  Tasha Howard is a 68 y.o.  female with multiple medical problems including sepsis, pressure injury of skin, severe protein calorie malnutrition, history of CVA, left sided hemiplegia, heart failure, hypertension, diabetes, diabetic neuropathy, multiple myelomia s/p bone marrow transplant 2016; chemotherapy on hold due to recent declines. History of DVT. Palliative Care was asked to help address goals of care. She was recently hospitalized 10/30/2018 with sepsis, acute hypoxic respiratory failure. Tasha Howard currently resides at home; her daughter Otila Kluver is here from [place], [place] for an undetermined amount of time to help take care of patient. Otila Kluver states one of her brother comes in the afternoon as well to help with patient. Otila Kluver has been assisting with all adls. She states patient is transferred with stand and pivot. She is up to wheel chair daily. Hoyer lift is present in the home and was delivered yesterday. Otila Kluver states patient's appetite has been "okay." She has nutritional supplement ordered, but does not have any in the home. Per log, her blood sugars this week range from 217-435mg /dL. She has not been receiving her lantus; what she had in the home had been thrown out due to being expired. Otila Kluver is to pick up refill from pharmacy. She has been receiving usually 3 units of humalog three times a day. Tasha Howard  states she has been taking per sliding scale, although none SS directions present; current written orders in the home  state 10units TID with meals. Tasha Howard denies pain, dyspnea, nausea or constipation. Otila Kluver reports occasional loose stools; receives imodium with effectiveness. She denies dysphagia. Tasha Howard has sacral wound and unstageable wound to her left heel. She has occasional swelling to left hand. No sleep difficulty. Tasha Howard states she is smoking around 1/2 pack of cigarettes a day. Otila Kluver expresses needing assistance with filling out Medicaid forms and getting meals on wheels. She states Tasha Howard had lost her food stamp card.  CODE STATUS: Full Code  PPS: 40% HOSPICE ELIGIBILITY/DIAGNOSIS: TBD  PAST MEDICAL HISTORY:  Past Medical History:  Diagnosis Date   Bone cancer ([place])    Cancer ([place])    Diabetic neuropathy ([place])    DM (diabetes mellitus) ([place])    DVT (deep vein thrombosis) in pregnancy    Hypertension    Stroke [facility])     SOCIAL HX:  Social History   Tobacco Use   Smoking status: Former Smoker   Smokeless tobacco: Never Used  Substance Use Topics   Alcohol use: Not Currently    ALLERGIES:  Allergies  Allergen Reactions   Metformin And Related Nausea Only     PERTINENT MEDICATIONS:  Outpatient Encounter Medications as of 11/15/2018  Medication Sig   acetaminophen (TYLENOL) 325 MG tablet Take 650 mg by mouth every 6 (six) hours as needed for pain.   apixaban (ELIQUIS) 2.5 MG TABS tablet Take 2.5 mg by mouth 2 (two) times daily.   atorvastatin (LIPITOR) 80 MG tablet Take 1 tablet (80 mg total) by mouth every evening for 30 days.   butalbital-acetaminophen-caffeine (FIORICET, ESGIC) 50-325-40 MG tablet Take 1 tablet by mouth every 6 (six) hours as needed for headache.   cetirizine (ZYRTEC) 10 MG tablet Take 10 mg by mouth daily.   collagenase (SANTYL) ointment Apply topically daily. To affected area   diclofenac sodium (VOLTAREN) 1 % GEL Apply 2 g topically 4 (four) times daily.   DULoxetine (CYMBALTA) 60 MG capsule Take 60 mg by mouth daily.    feeding supplement, ENSURE ENLIVE, (ENSURE ENLIVE) LIQD Take 237 mLs by mouth 2 (two) times daily between meals.   furosemide (LASIX) 20 MG tablet Take 20 mg by mouth daily.   guaiFENesin (ROBITUSSIN) 100 MG/5ML SOLN Take 5 mLs (100 mg total) by mouth every 4 (four) hours as needed for cough or to loosen phlegm.   Melatonin 3 MG TABS Take 6 mg by mouth every evening.   metoprolol tartrate (LOPRESSOR) 25 MG tablet Take 0.5 tablets (12.5 mg total) by mouth 2 (two) times daily.   mirtazapine (REMERON) 15 MG tablet Take 15 mg by mouth at bedtime.    Multiple Minerals (MULTI-MINERALS PO) Take 1 tablet by mouth daily.   nitroGLYCERIN (NITROLINGUAL) 0.4 MG/SPRAY spray Place 0.4 mg under the tongue every 5 (five) minutes x 3 doses as needed for chest pain.    oxyCODONE-acetaminophen (PERCOCET) 7.5-325 MG tablet Take 1 tablet by mouth every 8 (eight) hours as needed for pain.   pregabalin (LYRICA) 75 MG capsule Take 75 mg by mouth 2 (two) times daily.   thiamine 100 MG tablet Take 200 mg by mouth 2 (two) times daily.   valACYclovir (VALTREX) 500 MG tablet Take 500 mg by mouth daily.   vitamin C (ASCORBIC ACID) 500 MG tablet Take 500 mg by mouth 2 (two) times daily.   zinc sulfate 220 (50  Zn) MG capsule Take 220 mg by mouth daily.   albuterol (PROVENTIL HFA;VENTOLIN HFA) 108 (90 Base) MCG/ACT inhaler Inhale 2 puffs into the lungs every 6 (six) hours as needed for wheezing.   insulin glargine (LANTUS) 100 UNIT/ML injection Inject 6 Units into the skin Nightly.   insulin lispro (HUMALOG) 100 UNIT/ML injection Inject 10 Units into the skin 3 (three) times daily with meals. Has been taking 3 units at meal times   losartan (COZAAR) 25 MG tablet Take 12.5 mg by mouth daily.   nicotine (NICODERM CQ - DOSED IN MG/24 HOURS) 14 mg/24hr patch Place 1 patch (14 mg total) onto the skin daily. (Patient not taking: Reported on 11/15/2018)   [DISCONTINUED] cefdinir (OMNICEF) 300 MG capsule Take 1 capsule  (300 mg total) by mouth 2 (two) times daily.   No facility-administered encounter medications on file as of 11/15/2018.     PHYSICAL EXAM:   General: NAD, frail appearing Cardiovascular: regular rate and rhythm Pulmonary: clear ant fields Abdomen: soft, nontender, + bowel sounds GU: no suprapubic tenderness Extremities: no edema, no joint deformities Neurological: grips unequal, left sided weakness  Ezekiel Slocumb, NP

## 2018-11-19 ENCOUNTER — Other Ambulatory Visit: Payer: Self-pay

## 2018-11-19 ENCOUNTER — Emergency Department
Admission: EM | Admit: 2018-11-19 | Discharge: 2018-11-19 | Disposition: A | Discharge status: Home / Self Care | Payer: Medicare HMO | Attending: Emergency Medicine | Admitting: Emergency Medicine

## 2018-11-19 ENCOUNTER — Encounter: Payer: Self-pay | Admitting: Emergency Medicine

## 2018-11-19 DIAGNOSIS — Z87891 Personal history of nicotine dependence: Secondary | ICD-10-CM | POA: Diagnosis not present

## 2018-11-19 DIAGNOSIS — E1140 Type 2 diabetes mellitus with diabetic neuropathy, unspecified: Secondary | ICD-10-CM | POA: Insufficient documentation

## 2018-11-19 DIAGNOSIS — R739 Hyperglycemia, unspecified: Secondary | ICD-10-CM

## 2018-11-19 DIAGNOSIS — E1165 Type 2 diabetes mellitus with hyperglycemia: Secondary | ICD-10-CM | POA: Diagnosis not present

## 2018-11-19 DIAGNOSIS — Z79899 Other long term (current) drug therapy: Secondary | ICD-10-CM | POA: Diagnosis not present

## 2018-11-19 DIAGNOSIS — Z7901 Long term (current) use of anticoagulants: Secondary | ICD-10-CM | POA: Diagnosis not present

## 2018-11-19 DIAGNOSIS — I10 Essential (primary) hypertension: Secondary | ICD-10-CM | POA: Insufficient documentation

## 2018-11-19 DIAGNOSIS — Z794 Long term (current) use of insulin: Secondary | ICD-10-CM | POA: Insufficient documentation

## 2018-11-19 DIAGNOSIS — E876 Hypokalemia: Secondary | ICD-10-CM

## 2018-11-19 LAB — CBC WITH DIFFERENTIAL/PLATELET
Abs Immature Granulocytes: 0.01 10*3/uL (ref 0.00–0.07)
Basophils Absolute: 0 10*3/uL (ref 0.0–0.1)
Basophils Relative: 1 %
Eosinophils Absolute: 0.2 10*3/uL (ref 0.0–0.5)
Eosinophils Relative: 2 %
HCT: 27.7 % — ABNORMAL LOW (ref 36.0–46.0)
Hemoglobin: 8.7 g/dL — ABNORMAL LOW (ref 12.0–15.0)
Immature Granulocytes: 0 %
LYMPHS ABS: 2 10*3/uL (ref 0.7–4.0)
Lymphocytes Relative: 25 %
MCH: 30.3 pg (ref 26.0–34.0)
MCHC: 31.4 g/dL (ref 30.0–36.0)
MCV: 96.5 fL (ref 80.0–100.0)
Monocytes Absolute: 0.5 10*3/uL (ref 0.1–1.0)
Monocytes Relative: 6 %
Neutro Abs: 5.2 10*3/uL (ref 1.7–7.7)
Neutrophils Relative %: 66 %
Platelets: 199 10*3/uL (ref 150–400)
RBC: 2.87 MIL/uL — ABNORMAL LOW (ref 3.87–5.11)
RDW: 16.6 % — ABNORMAL HIGH (ref 11.5–15.5)
WBC: 7.9 10*3/uL (ref 4.0–10.5)
nRBC: 0 % (ref 0.0–0.2)

## 2018-11-19 LAB — COMPREHENSIVE METABOLIC PANEL
ALT: 34 U/L (ref 0–44)
AST: 45 U/L — ABNORMAL HIGH (ref 15–41)
Albumin: 3 g/dL — ABNORMAL LOW (ref 3.5–5.0)
Alkaline Phosphatase: 131 U/L — ABNORMAL HIGH (ref 38–126)
Anion gap: 8 (ref 5–15)
BUN: 12 mg/dL (ref 8–23)
CO2: 25 mmol/L (ref 22–32)
Calcium: 8 mg/dL — ABNORMAL LOW (ref 8.9–10.3)
Chloride: 107 mmol/L (ref 98–111)
Creatinine, Ser: 1.05 mg/dL — ABNORMAL HIGH (ref 0.44–1.00)
GFR calc Af Amer: >60 mL/min (ref >60)
GFR calc non Af Amer: 55 mL/min — ABNORMAL LOW (ref >60)
GLUCOSE: 322 mg/dL — AB (ref 70–99)
Potassium: 2.8 mmol/L — ABNORMAL LOW (ref 3.5–5.1)
Sodium: 140 mmol/L (ref 135–145)
Total Bilirubin: 0.2 mg/dL — ABNORMAL LOW (ref 0.3–1.2)
Total Protein: 5.6 g/dL — ABNORMAL LOW (ref 6.5–8.1)

## 2018-11-19 LAB — GLUCOSE, CAPILLARY
Glucose-Capillary: 280 mg/dL — ABNORMAL HIGH (ref 70–99)
Glucose-Capillary: 193 mg/dL — ABNORMAL HIGH (ref 70–99)

## 2018-11-19 MED ORDER — POTASSIUM CHLORIDE CRYS ER 20 MEQ PO TBCR
40.0000 meq | EXTENDED_RELEASE_TABLET | Freq: Once | ORAL | Status: AC
  Administered 2018-11-19: 40 meq via ORAL
  Filled 2018-11-19: qty 2

## 2018-11-19 MED ORDER — POTASSIUM CHLORIDE CRYS ER 20 MEQ PO TBCR: 20.0000 meq | EXTENDED_RELEASE_TABLET | Freq: Every day | ORAL | 0 refills | Status: AC

## 2018-11-19 NOTE — ED Triage Notes (Signed)
Pt to triage via w/c with no distress noted, brought in by EMS for hyperglycemia, in the 400's; pt denies any c/o

## 2018-11-19 NOTE — Discharge Instructions (Addendum)
As we discussed, though your blood sugar is running high, it is not dangerous at this time.  Making adjustments in the Emergency Department (ED) and possibly causing your glucose level to drop too low is more dangerous than continuing your current medications at this time until you can follow up with your clinic doctor.  Your potassium level is a bit low tonight, so please take the supplement prescribed and discuss this with your primary care provider.  Please continue your medications and follow up with your regular doctor as recommended in these documents.  If you develop new or worsening symptoms that concern you, please return to the Emergency Department.

## 2018-11-19 NOTE — ED Provider Notes (Signed)
[facility] Emergency Department Provider Note  ____________________________________________   First MD Initiated Contact with Patient 11/19/18 [phone]     (approximate)  I have reviewed the triage vital signs and the nursing notes.   HISTORY  Chief Complaint Hyperglycemia    HPI Azoria Abbett is a 68 y.o. female with medical issues as listed below which notably includes insulin-dependent diabetes.  She presents for evaluation of hyperglycemia at home with readings on her monitor as high as 500-600.  She has a family member with her who helps her monitor her blood sugar and says that it has been extremely variable.  Earlier in the evening it was about 170, then went up to 400, then 500 and possibly as high as 600.  She got her insulin in the meantime but her blood sugar stayed high so they called EMS for further evaluation.  The patient states she is felt fine the whole time.  She denies headache, visual changes, nausea, vomiting, and abdominal pain.  She has had no chest pain or shortness of breath.  She has had a number of loose bowel movements today but has no abdominal pain associated with it.  She denies dysuria.  She has had issues in the past with glucose control but this was worse than usual.  The elevation was severe even though is asymptomatic and nothing in particular seems to make it better or worse.         Past Medical History:  Diagnosis Date   Bone cancer ([place])    Cancer ([place])    Diabetic neuropathy ([place])    DM (diabetes mellitus) ([place])    DVT (deep vein thrombosis) in pregnancy    Hypertension    Stroke [facility])     Patient Active Problem List   Diagnosis Date Noted   History of CVA (cerebrovascular accident)    Left hemiplegia ([place])    Advanced care planning/counseling discussion    Goals of care, counseling/discussion    Palliative care by specialist    Pressure injury of skin 10/31/2018   Protein-calorie  malnutrition, severe 10/31/2018   Sepsis ([place]) 10/30/2018    Past Surgical History:  Procedure Laterality Date   ABDOMINAL HYSTERECTOMY      Prior to Admission medications   Medication Sig Start Date End Date Taking? Authorizing Provider  acetaminophen (TYLENOL) 325 MG tablet Take 650 mg by mouth every 6 (six) hours as needed for pain. 09/25/18   [provider]  albuterol (PROVENTIL HFA;VENTOLIN HFA) 108 (90 Base) MCG/ACT inhaler Inhale 2 puffs into the lungs every 6 (six) hours as needed for wheezing. 12/28/15   [provider]  apixaban (ELIQUIS) 2.5 MG TABS tablet Take 2.5 mg by mouth 2 (two) times daily. 09/25/18   [provider]  atorvastatin (LIPITOR) 80 MG tablet Take 1 tablet (80 mg total) by mouth every evening for 30 days. 11/02/18 12/02/18  Salary, Avel Peace, MD  butalbital-acetaminophen-caffeine (FIORICET, ESGIC) [id number] MG tablet Take 1 tablet by mouth every 6 (six) hours as needed for headache. 09/25/18   [provider]  cetirizine (ZYRTEC) 10 MG tablet Take 10 mg by mouth daily. 03/28/17   [provider]  collagenase (SANTYL) ointment Apply topically daily. To affected area 11/03/18   Salary, Holly Bodily D, MD  diclofenac sodium (VOLTAREN) 1 % GEL Apply 2 g topically 4 (four) times daily. 02/08/18 02/08/19  [provider]  DULoxetine (CYMBALTA) 60 MG capsule Take 60 mg by mouth daily. 09/26/18  11/15/18  [provider]  feeding supplement, ENSURE ENLIVE, (ENSURE ENLIVE) LIQD Take 237 mLs by mouth 2 (two) times daily between meals. 11/02/18   Salary, Avel Peace, MD  furosemide (LASIX) 20 MG tablet Take 20 mg by mouth daily. 04/17/18   [provider]  guaiFENesin (ROBITUSSIN) 100 MG/5ML SOLN Take 5 mLs (100 mg total) by mouth every 4 (four) hours as needed for cough or to loosen phlegm. 11/02/18   Salary, Holly Bodily D, MD  insulin glargine (LANTUS) 100 UNIT/ML injection Inject 6 Units into the skin Nightly. 09/25/18 10/30/18   [provider]  insulin lispro (HUMALOG) 100 UNIT/ML injection Inject 10 Units into the skin 3 (three) times daily with meals. Has been taking 3 units at meal times    [provider]  losartan (COZAAR) 25 MG tablet Take 12.5 mg by mouth daily. 09/26/18 10/30/18  [provider]  Melatonin 3 MG TABS Take 6 mg by mouth every evening. 09/25/18   [provider]  metoprolol tartrate (LOPRESSOR) 25 MG tablet Take 0.5 tablets (12.5 mg total) by mouth 2 (two) times daily. 11/02/18   Salary, Avel Peace, MD  mirtazapine (REMERON) 15 MG tablet Take 15 mg by mouth at bedtime.     [provider]  Multiple Minerals (MULTI-MINERALS PO) Take 1 tablet by mouth daily. 09/26/18   [provider]  nicotine (NICODERM CQ - DOSED IN MG/24 HOURS) 14 mg/24hr patch Place 1 patch (14 mg total) onto the skin daily. Patient not taking: Reported on 11/15/2018 11/03/18   Salary, Holly Bodily D, MD  nitroGLYCERIN (NITROLINGUAL) 0.4 MG/SPRAY spray Place 0.4 mg under the tongue every 5 (five) minutes x 3 doses as needed for chest pain.  07/12/18 07/12/19  [provider]  oxyCODONE-acetaminophen (PERCOCET) 7.5-325 MG tablet Take 1 tablet by mouth every 8 (eight) hours as needed for pain. 08/30/18   [provider]  pregabalin (LYRICA) 75 MG capsule Take 75 mg by mouth 2 (two) times daily. 08/30/18 11/28/18  [provider]  thiamine 100 MG tablet Take 200 mg by mouth 2 (two) times daily. 09/25/18 09/25/19  [provider]  valACYclovir (VALTREX) 500 MG tablet Take 500 mg by mouth daily. 06/03/18   [provider]  vitamin C (ASCORBIC ACID) 500 MG tablet Take 500 mg by mouth 2 (two) times daily.    [provider]  zinc sulfate 220 (50 Zn) MG capsule Take 220 mg by mouth daily.    [provider]    Allergies Metformin and related  Family History  Problem Relation Age of Onset   Cancer Mother    Diabetes Mother    Heart  disease Mother    Cancer Father     Social History Social History   Tobacco Use   Smoking status: Former Smoker   Smokeless tobacco: Never Used  Substance Use Topics   Alcohol use: Not Currently   Drug use: Not Currently    Review of Systems Constitutional: Hypoglycemia.  No fever/chills Eyes: No visual changes. ENT: No sore throat. Cardiovascular: Denies chest pain. Respiratory: Denies shortness of breath. Gastrointestinal: No abdominal pain.  No nausea, no vomiting.  Multiple episodes of loose stool over the course of the day.  No constipation. Genitourinary: Negative for dysuria. Musculoskeletal: Negative for neck pain.  Negative for back pain. Integumentary: Negative for rash. Neurological: Negative for headaches, focal weakness or numbness.   ____________________________________________   PHYSICAL EXAM:  VITAL SIGNS: ED Triage Vitals  Enc Vitals Group  BP 11/19/18 0102 (!) 126/58     Pulse Rate 11/19/18 0102 63     Resp 11/19/18 0102 18     Temp 11/19/18 0102 97.9 F (36.6 C)     Temp Source 11/19/18 0102 Oral     SpO2 11/19/18 0102 95 %     Weight 11/19/18 0104 53.5 kg (118 lb)     Height 11/19/18 0104 1.676 m (5\' 6" )     Head Circumference --      Peak Flow --      Pain Score 11/19/18 0104 0     Pain Loc --      Pain Edu? --      Excl. in [place]? --     Constitutional: Alert and oriented.  Appears chronically ill but is in no acute distress.   Eyes: Conjunctivae are normal.  Head: Atraumatic. Nose: No congestion/rhinnorhea. Mouth/Throat: Mucous membranes are moist. Neck: No stridor.  No meningeal signs.   Cardiovascular: Normal rate, regular rhythm. Good peripheral circulation. Grossly normal heart sounds. Respiratory: Normal respiratory effort.  No retractions. Lungs CTAB. Gastrointestinal: Cachectic body habitus.  Soft and nontender. No distention.  Musculoskeletal: No lower extremity tenderness nor edema. No gross deformities of  extremities. Neurologic:  Normal speech and language. No gross focal neurologic deficits are appreciated.  Skin:  Skin is warm, dry and intact. No rash noted. Psychiatric: Mood and affect are normal. Speech and behavior are normal.  ____________________________________________   LABS (all labs ordered are listed, but only abnormal results are displayed)  Labs Reviewed  CBC WITH DIFFERENTIAL/PLATELET - Abnormal; Notable for the following components:      Result Value   RBC 2.87 (*)    Hemoglobin 8.7 (*)    HCT 27.7 (*)    RDW 16.6 (*)    All other components within normal limits  COMPREHENSIVE METABOLIC PANEL - Abnormal; Notable for the following components:   Potassium 2.8 (*)    Glucose, Bld 322 (*)    Creatinine, Ser 1.05 (*)    Calcium 8.0 (*)    Total Protein 5.6 (*)    Albumin 3.0 (*)    AST 45 (*)    Alkaline Phosphatase 131 (*)    Total Bilirubin 0.2 (*)    GFR calc non Af Amer 55 (*)    All other components within normal limits  GLUCOSE, CAPILLARY - Abnormal; Notable for the following components:   Glucose-Capillary 280 (*)    All other components within normal limits  GLUCOSE, CAPILLARY - Abnormal; Notable for the following components:   Glucose-Capillary 193 (*)    All other components within normal limits  URINALYSIS, COMPLETE (UACMP) WITH MICROSCOPIC  CBG MONITORING, ED   ____________________________________________  EKG  None - EKG not ordered by ED physician ____________________________________________  RADIOLOGY   ED MD interpretation: No indication for imaging  Official radiology report(s): No results found.  ____________________________________________   PROCEDURES   Procedure(s) performed (including Critical Care):  Procedures   ____________________________________________   INITIAL IMPRESSION / MDM / ASSESSMENT AND PLAN / ED COURSE  As part of my medical decision making, I reviewed the following data within the [place] History obtained from family, Nursing notes reviewed and incorporated, Labs reviewed , Old chart reviewed and Notes from prior ED visits         Differential diagnosis includes, but is not limited to, poorly controlled diabetes with hyperglycemia, DKA, hyperglycemic hyperosmotic syndrome, acute infection, metabolic or electrolyte abnormality.  The patient  has been asymptomatic other than a few loose stools today and she says that she feels fine.  Her blood work is notable for some mild hyperglycemia of 320 which went down to 193 with no intervention in the emergency department.  She has a slightly low potassium for which I will give her a potassium supplement by mouth and a prescription for some more potassium.  Her albumin is low which I believe is consistent with her chronic illness but not indicative of an acute issue.  She has no leukocytosis and stable vital signs.  Given that she is asymptomatic with a normal anion gap and her glucose is improved significantly without intervention I think she is appropriate for discharge and outpatient follow-up.  She and her family member understand and agree and they will follow-up with her primary care provider.  I gave my usual customary return precautions.  Clinical Course as of Nov 18 425  Tue Nov 19, 2018  0421 Glucose-Capillary(!): 193 [CF]    Clinical Course User Index [CF] Hinda Kehr, MD    ____________________________________________  FINAL CLINICAL IMPRESSION(S) / ED DIAGNOSES  Final diagnoses:  Hyperglycemia     MEDICATIONS GIVEN DURING THIS VISIT:  Medications - No data to display   ED Discharge Orders    None       Note:  This document was prepared using Dragon voice recognition software and may include unintentional dictation errors.   Hinda Kehr, MD 11/19/18 [phone]

## 2018-12-03 ENCOUNTER — Telehealth: Payer: Self-pay | Admitting: Student

## 2018-12-03 NOTE — Telephone Encounter (Signed)
Palliative NP left message to arrange follow up visit, likely telehealth visit due to Covid-19. Awaiting return call.

## 2018-12-05 ENCOUNTER — Telehealth: Payer: Self-pay

## 2018-12-05 NOTE — Telephone Encounter (Signed)
Phone call placed to daughter to verify pharmacy. Daughter shared that [facility] in [place] is the pharmacy. PCP office notified

## 2018-12-05 NOTE — Telephone Encounter (Signed)
At the direction of Palliative NP, phone call placed to PCP office to request a refill of oxycodone-acetaminophen 7.5mg . Spoke with nurse who asked that I verify pharmacy with patient/family.

## 2018-12-06 ENCOUNTER — Telehealth: Payer: Self-pay | Admitting: Student

## 2018-12-06 NOTE — Telephone Encounter (Signed)
Palliative NP called x 2 and left message regarding Telehealth visit scheduled for today as patient/daughter did not join meeting. Awaiting return call.

## 2018-12-09 ENCOUNTER — Telehealth: Payer: Self-pay | Admitting: Student

## 2018-12-09 NOTE — Telephone Encounter (Signed)
NP spoke with daughter Otila Kluver. Telehealth appointment scheduled for 12/10/2018 at 1130am.

## 2018-12-10 ENCOUNTER — Telehealth: Payer: Self-pay | Admitting: Student

## 2018-12-10 NOTE — Telephone Encounter (Signed)
NP left message as scheduled Telehealth visit was planned for today at 11:30am. Awaiting return call.

## 2018-12-12 ENCOUNTER — Other Ambulatory Visit: Payer: Medicare HMO | Admitting: Student

## 2018-12-12 ENCOUNTER — Other Ambulatory Visit: Payer: Self-pay

## 2018-12-12 DIAGNOSIS — Z515 Encounter for palliative care: Secondary | ICD-10-CM

## 2018-12-12 NOTE — Progress Notes (Signed)
[facility] Consult Note Telephone: [phone]  Fax: [phone]  PATIENT NAME: Tasha Howard DOB: Jan 09, 1951 MRN: [id number]  PRIMARY CARE PROVIDER:   Dr. Ronie Howard PROVIDER: Shelbie Hutching, MD Tasha Howard, [place] [address]     ASSESSMENT: Due to the COVID-19 crisis, this virtual check-in visit was done via telephone from my office and it was initiated and consent by this patient and or family. NP discussed ongoing goals of care with Tasha Howard and daughter Tasha Howard. We discussed symptom management. NP also attempted to discuss code status again; Tasha Howard states she would like to discuss this in person. Palliative Medicine will continue to provide support, provide recommendations as needed.        RECOMMENDATIONS and PLAN:  1. Code status: Full Code; patient would like to discuss on next visit. 2. Medical goals of therapy: she will continue to receive home health services through [facility]. Follow up with PCP and Oncologist ast scheduled. Palliative Medicine will monitor for changes, declines and provide symptom management as needed. 3. Symptom management: pain-continue oxycodone-acetaminophen 7.5-325mg  one tab every 8 hours prn. Hyperglycemia-continue to give insulins as directed; continue to keep log of blood sugars. Wound care-Daughter to notify home health nurse to report new "knot" over sacral wound and have area assessed. Daughter to continue providing wound care.  4. Discharge Planning: Tasha Howard will continue to reside at home with assistance of her children. 5. Emotional support: discussed with Tasha Howard and daughter Tasha Howard. They are encouraged to call as needs arise.   Palliative Medicine will follow up via phone in 4 weeks or sooner, if needed.  I spent 25 minutes providing this consultation,  from 2:00pm to 2:25pm. More than 50% of the time in this consultation was spent  coordinating communication.   HISTORY OF PRESENT ILLNESS:  Tasha Howard is a 68 y.o. female with multiple medical problems including sepsis, pressure injury of skin, severe protein calorie malnutrition, history of CVA, left sided hemiplegia, heart failure, hypertension, diabetes, diabetic neuropathy, multiple myelomia s/p bone marrow transplant 2016; chemotherapy on hold due to recent declines. History of DVT. Palliative Care was asked to help address goals of care; follow up visit today. She had telephone visit with her PCP earlier this week. She reports doing better since last Palliative medicine visit in the home. She is still receiving therapy per daughter Tasha Howard; they last visited last week. Tasha Howard would like to limit outside visitors due to Covid-19 crisis. Tasha Howard has ambulated some from her bedroom to kitchen per Tasha Howard. She does report left leg pain and a "heaviness" to her foot. Tasha Howard states that "scab" fell off her heel last Thursday and area was left open to air per home health nurse. Her wound to sacrum has improved and decreased in size. Tasha Howard now reports a "knot" over the sacral wound; she states area is hard to touch, no warmth or discoloration to area. Her appetite has improved and good appetite endorsed by patient and Tasha Howard. She denies any swallowing difficulty. Her blood sugars range from usually 130's to 350mg /dL in the past several weeks. She did have and ER visit on 11/19/2018 due to hyperglycemia. She denies any symptoms of hypo or hyperglycemia. Her blood sugar this morning was 85mg /dL. Edema has improved to feet and hand. Tasha Howard reports the percocet being helpful for her pain. Tasha Howard states that Tasha Howard's Medicaid was approved. No other needs expressed by Ms.  Tasha Howard. She has an upcoming appointment with Oncologist; she states they will discuss plan going forward regarding treatment.   CODE STATUS: Full Code  PPS: 40% HOSPICE ELIGIBILITY/DIAGNOSIS: TBD  PAST MEDICAL  HISTORY:  Past Medical History:  Diagnosis Date   Bone cancer ([place])    Cancer ([place])    Diabetic neuropathy ([place])    DM (diabetes mellitus) ([place])    DVT (deep vein thrombosis) in pregnancy    Hypertension    Stroke [facility])     SOCIAL HX:  Social History   Tobacco Use   Smoking status: Former Smoker   Smokeless tobacco: Never Used  Substance Use Topics   Alcohol use: Not Currently    ALLERGIES:  Allergies  Allergen Reactions   Metformin And Related Nausea Only       PHYSICAL EXAM:   General: NAD  Tasha Slocumb, NP

## 2019-01-16 ENCOUNTER — Emergency Department: Payer: Medicare HMO

## 2019-01-16 ENCOUNTER — Emergency Department
Admission: EM | Admit: 2019-01-16 | Discharge: 2019-01-17 | Disposition: A | Discharge status: Home / Self Care | Payer: Medicare HMO | Attending: Emergency Medicine | Admitting: Emergency Medicine

## 2019-01-16 ENCOUNTER — Other Ambulatory Visit: Payer: Self-pay

## 2019-01-16 DIAGNOSIS — R197 Diarrhea, unspecified: Secondary | ICD-10-CM | POA: Insufficient documentation

## 2019-01-16 DIAGNOSIS — R531 Weakness: Secondary | ICD-10-CM | POA: Diagnosis present

## 2019-01-16 DIAGNOSIS — Z8673 Personal history of transient ischemic attack (TIA), and cerebral infarction without residual deficits: Secondary | ICD-10-CM | POA: Insufficient documentation

## 2019-01-16 DIAGNOSIS — E119 Type 2 diabetes mellitus without complications: Secondary | ICD-10-CM | POA: Diagnosis not present

## 2019-01-16 DIAGNOSIS — Z87891 Personal history of nicotine dependence: Secondary | ICD-10-CM | POA: Diagnosis not present

## 2019-01-16 DIAGNOSIS — I10 Essential (primary) hypertension: Secondary | ICD-10-CM | POA: Diagnosis not present

## 2019-01-16 LAB — CBC WITH DIFFERENTIAL/PLATELET
Abs Immature Granulocytes: 0.02 10*3/uL (ref 0.00–0.07)
Basophils Absolute: 0 10*3/uL (ref 0.0–0.1)
Basophils Relative: 0 %
Eosinophils Absolute: 0 10*3/uL (ref 0.0–0.5)
Eosinophils Relative: 0 %
HCT: 30.4 % — ABNORMAL LOW (ref 36.0–46.0)
Hemoglobin: 9.3 g/dL — ABNORMAL LOW (ref 12.0–15.0)
Immature Granulocytes: 0 %
Lymphocytes Relative: 20 %
Lymphs Abs: 1.9 10*3/uL (ref 0.7–4.0)
MCH: 31.7 pg (ref 26.0–34.0)
MCHC: 30.6 g/dL (ref 30.0–36.0)
MCV: 103.8 fL — ABNORMAL HIGH (ref 80.0–100.0)
Monocytes Absolute: 0.5 10*3/uL (ref 0.1–1.0)
Monocytes Relative: 5 %
Neutro Abs: 7.1 10*3/uL (ref 1.7–7.7)
Neutrophils Relative %: 75 %
Platelets: 126 10*3/uL — ABNORMAL LOW (ref 150–400)
RBC: 2.93 MIL/uL — ABNORMAL LOW (ref 3.87–5.11)
RDW: 13.8 % (ref 11.5–15.5)
WBC: 9.6 10*3/uL (ref 4.0–10.5)
nRBC: 0 % (ref 0.0–0.2)

## 2019-01-16 LAB — COMPREHENSIVE METABOLIC PANEL
ALT: 18 U/L (ref 0–44)
AST: 20 U/L (ref 15–41)
Albumin: 3.7 g/dL (ref 3.5–5.0)
Alkaline Phosphatase: 114 U/L (ref 38–126)
Anion gap: 10 (ref 5–15)
BUN: 14 mg/dL (ref 8–23)
CO2: 23 mmol/L (ref 22–32)
Calcium: 9.1 mg/dL (ref 8.9–10.3)
Chloride: 106 mmol/L (ref 98–111)
Creatinine, Ser: 0.89 mg/dL (ref 0.44–1.00)
GFR calc Af Amer: >60 mL/min (ref >60)
GFR calc non Af Amer: >60 mL/min (ref >60)
Glucose, Bld: 259 mg/dL — ABNORMAL HIGH (ref 70–99)
Potassium: 4.1 mmol/L (ref 3.5–5.1)
Sodium: 139 mmol/L (ref 135–145)
Total Bilirubin: 0.4 mg/dL (ref 0.3–1.2)
Total Protein: 6.3 g/dL — ABNORMAL LOW (ref 6.5–8.1)

## 2019-01-16 LAB — TROPONIN I: Troponin I: <0.03 ng/mL (ref <0.03)

## 2019-01-16 LAB — LIPASE, BLOOD: Lipase: 18 U/L (ref 11–51)

## 2019-01-16 MED ORDER — MORPHINE SULFATE (PF) 4 MG/ML IV SOLN: 4.0000 mg | Freq: Once | INTRAVENOUS | Status: DC

## 2019-01-16 MED ORDER — SODIUM CHLORIDE 0.9 % IV SOLN
Freq: Once | INTRAVENOUS | Status: AC
  Administered 2019-01-16: 23:00:00 via INTRAVENOUS

## 2019-01-16 MED ORDER — ONDANSETRON HCL 4 MG/2ML IJ SOLN: 4.0000 mg | Freq: Once | INTRAMUSCULAR | Status: DC

## 2019-01-16 NOTE — ED Notes (Signed)
Pt assisted with use of bedpan to collect urine. IV fluids infusing without difficulty. Pt unable to void at this time. Provided for comfort and safety and will continue to assess.

## 2019-01-16 NOTE — ED Notes (Signed)
2 unsuccessful IV attempts by this RN (left AC and left wrist).

## 2019-01-16 NOTE — ED Triage Notes (Signed)
Pt brought in from home by EMS for generalized weakness. Does have a recent hx of CVA with left sided deficits 2 months ago. Pt states has had diarrhea for 2-3 days with left sided abd pain. Pt does have hx of UTI with sepsis, fsbs by EMS 283.

## 2019-01-16 NOTE — ED Provider Notes (Signed)
[facility] Emergency Department Provider Note       Time seen: ----------------------------------------- 9:53 PM on 01/16/2019 -----------------------------------------   I have reviewed the triage vital signs and the nursing notes.  HISTORY   Chief Complaint Weakness    HPI Tasha Howard is a 68 y.o. female with a history of bone cancer, diabetic neuropathy, diabetes, DVT, hypertension, CVA who presents to the ED for generalized weakness.  Patient does have a recent stroke with left-sided deficits from 2 months ago.  She has had diarrhea for the past 2 to 3 days with left-sided abdominal pain.  Patient reports she has had about 4 episodes of diarrhea for the past several days.  She denies fevers, chills or other complaints.  Past Medical History:  Diagnosis Date  . Bone cancer ([place])   . Cancer ([place])   . Diabetic neuropathy ([place])   . DM (diabetes mellitus) ([place])   . DVT (deep vein thrombosis) in pregnancy   . Hypertension   . Stroke [facility])     Patient Active Problem List   Diagnosis Date Noted  . History of CVA (cerebrovascular accident)   . Left hemiplegia ([place])   . Advanced care planning/counseling discussion   . Goals of care, counseling/discussion   . Palliative care by specialist   . Pressure injury of skin 10/31/2018  . Protein-calorie malnutrition, severe 10/31/2018  . Sepsis ([place]) 10/30/2018    Past Surgical History:  Procedure Laterality Date  . ABDOMINAL HYSTERECTOMY      Allergies Metformin and related  Social History Social History   Tobacco Use  . Smoking status: Former Research scientist (life sciences)  . Smokeless tobacco: Never Used  Substance Use Topics  . Alcohol use: Not Currently  . Drug use: Not Currently   Review of Systems Constitutional: Negative for fever. Cardiovascular: Negative for chest pain. Respiratory: Negative for shortness of breath. Gastrointestinal: Positive for abdominal pain, diarrhea Musculoskeletal:  Negative for back pain. Skin: Negative for rash. Neurological: Negative for headaches, positive for weakness  All systems negative/normal/unremarkable except as stated in the HPI  ____________________________________________   PHYSICAL EXAM:  VITAL SIGNS: ED Triage Vitals  Enc Vitals Group     BP      Pulse      Resp      Temp      Temp src      SpO2      Weight      Height      Head Circumference      Peak Flow      Pain Score      Pain Loc      Pain Edu?      Excl. in [place]?    Constitutional: Alert and oriented. Well appearing and in no distress. Eyes: Conjunctivae are normal. Normal extraocular movements. ENT      Head: Normocephalic and atraumatic.      Nose: No congestion/rhinnorhea.      Mouth/Throat: Mucous membranes are moist.      Neck: No stridor. Cardiovascular: Normal rate, regular rhythm. No murmurs, rubs, or gallops. Respiratory: Normal respiratory effort without tachypnea nor retractions. Breath sounds are clear and equal bilaterally. No wheezes/rales/rhonchi. Gastrointestinal: Left lower quadrant tenderness, no rebound or guarding.  Normal bowel sounds. Musculoskeletal: Nontender with normal range of motion in extremities. No lower extremity tenderness nor edema. Neurologic:  Normal speech and language. No acute neurologic deficits are appreciated.  Skin:  Skin is warm, dry and intact. No rash noted. Psychiatric: Mood  and affect are normal. Speech and behavior are normal.  ____________________________________________  EKG: Interpreted by me.  Sinus tachycardia with a rate of 100 bpm, left axis deviation, normal QT  ____________________________________________  ED COURSE:  As part of my medical decision making, I reviewed the following data within the [place] History obtained from family if available, nursing notes, old chart and ekg, as well as notes from prior ED visits. Patient presented for weakness and diarrhea, we will assess  with labs and imaging as indicated at this time.   Procedures  Tasha Howard was evaluated in Emergency Department on 01/16/2019 for the symptoms described in the history of present illness. She was evaluated in the context of the global COVID-19 pandemic, which necessitated consideration that the patient might be at risk for infection with the SARS-CoV-2 virus that causes COVID-19. Institutional protocols and algorithms that pertain to the evaluation of patients at risk for COVID-19 are in a state of rapid change based on information released by regulatory bodies including the CDC and federal and state organizations. These policies and algorithms were followed during the patient's care in the ED.  ____________________________________________   LABS (pertinent positives/negatives)  Labs Reviewed  CBC WITH DIFFERENTIAL/PLATELET - Abnormal; Notable for the following components:      Result Value   RBC 2.93 (*)    Hemoglobin 9.3 (*)    HCT 30.4 (*)    MCV 103.8 (*)    Platelets 126 (*)    All other components within normal limits  COMPREHENSIVE METABOLIC PANEL  LIPASE, BLOOD  URINALYSIS, COMPLETE (UACMP) WITH MICROSCOPIC  TROPONIN I    RADIOLOGY Images were viewed by me  Abdomen 2 view IMPRESSION: No acute abnormality.  Mild gaseous distention of the colon noted.  ____________________________________________   DIFFERENTIAL DIAGNOSIS   Dehydration, electrolyte abnormality, colitis, diverticulitis  FINAL ASSESSMENT AND PLAN  Weakness, diarrhea   Plan: The patient had presented for weakness and diarrhea. Patient's labs are still pending at this time. Patient's imaging initially has not revealed any acute process, she has a chronic anemia.   Laurence Aly, MD    Note: This note was generated in part or whole with voice recognition software. Voice recognition is usually quite accurate but there are transcription errors that can and very often do occur. I  apologize for any typographical errors that were not detected and corrected.     Earleen Newport, MD 01/16/19 2253

## 2019-01-17 DIAGNOSIS — R531 Weakness: Secondary | ICD-10-CM | POA: Diagnosis not present

## 2019-01-17 LAB — URINALYSIS, COMPLETE (UACMP) WITH MICROSCOPIC
Bacteria, UA: NONE SEEN
Bilirubin Urine: NEGATIVE
Glucose, UA: 50 mg/dL — AB
Hgb urine dipstick: NEGATIVE
Ketones, ur: NEGATIVE mg/dL
Leukocytes,Ua: NEGATIVE
Nitrite: NEGATIVE
Protein, ur: 100 mg/dL — AB
Specific Gravity, Urine: 1.017 (ref 1.005–1.030)
Squamous Epithelial / HPF: NONE SEEN (ref 0–5)
pH: 6 (ref 5.0–8.0)

## 2019-01-17 MED ORDER — DICYCLOMINE HCL 20 MG PO TABS: 20.0000 mg | ORAL_TABLET | Freq: Four times a day (QID) | ORAL | 0 refills | Status: AC | PRN

## 2019-01-17 NOTE — Discharge Instructions (Addendum)
1.  You may take Bentyl as needed for abdominal discomfort. 2.  BRAT diet x3 days, then slowly advance diet as tolerated. 3.  Drink plenty of fluids daily. 4.  If you are able to collect a liquid stool specimen at home, you may return it to the outpatient laboratory for analysis. 5.  Return to the ER for worsening symptoms, persistent vomiting, difficulty breathing or other concerns.

## 2019-01-17 NOTE — ED Provider Notes (Signed)
-----------------------------------------   12:49 AM on 01/17/2019 -----------------------------------------  Updated patient on urine results.  She is resting comfortably without distress.  Examined abdomen which is nontender to palpation.  Has not provided stool specimen.  We discussed option to proceed with CT scan to evaluate for possible colitis versus discharge home.  Patient desires to be discharged home.  Will do so with stool collection kit.  Strict return precautions given.  Patient verbalizes understanding agrees with plan of care.   Paulette Blanch, MD 01/17/19 [phone]

## 2019-01-17 NOTE — ED Notes (Addendum)
EMS at the bedside to transport pt

## 2019-02-27 ENCOUNTER — Other Ambulatory Visit: Payer: Self-pay | Admitting: Hematology

## 2019-02-27 DIAGNOSIS — Z20822 Contact with and (suspected) exposure to covid-19: Secondary | ICD-10-CM

## 2019-02-27 NOTE — Progress Notes (Signed)
Request for COVID testing from the health department / Contacted patient and scheduled testing / Orders placed /

## 2019-02-28 ENCOUNTER — Emergency Department
Admission: EM | Admit: 2019-02-28 | Discharge: 2019-02-28 | Disposition: A | Discharge status: Home / Self Care | Payer: Medicare HMO | Attending: Emergency Medicine | Admitting: Emergency Medicine

## 2019-02-28 ENCOUNTER — Other Ambulatory Visit: Payer: Medicare HMO

## 2019-02-28 ENCOUNTER — Encounter: Payer: Self-pay | Admitting: Emergency Medicine

## 2019-02-28 ENCOUNTER — Other Ambulatory Visit: Payer: Self-pay

## 2019-02-28 DIAGNOSIS — Z20828 Contact with and (suspected) exposure to other viral communicable diseases: Secondary | ICD-10-CM

## 2019-02-28 DIAGNOSIS — Z7901 Long term (current) use of anticoagulants: Secondary | ICD-10-CM | POA: Insufficient documentation

## 2019-02-28 DIAGNOSIS — F17210 Nicotine dependence, cigarettes, uncomplicated: Secondary | ICD-10-CM | POA: Insufficient documentation

## 2019-02-28 DIAGNOSIS — I10 Essential (primary) hypertension: Secondary | ICD-10-CM | POA: Insufficient documentation

## 2019-02-28 DIAGNOSIS — E876 Hypokalemia: Secondary | ICD-10-CM

## 2019-02-28 DIAGNOSIS — E1140 Type 2 diabetes mellitus with diabetic neuropathy, unspecified: Secondary | ICD-10-CM | POA: Diagnosis not present

## 2019-02-28 DIAGNOSIS — B349 Viral infection, unspecified: Secondary | ICD-10-CM | POA: Insufficient documentation

## 2019-02-28 DIAGNOSIS — Z79899 Other long term (current) drug therapy: Secondary | ICD-10-CM | POA: Diagnosis not present

## 2019-02-28 DIAGNOSIS — Z20822 Contact with and (suspected) exposure to covid-19: Secondary | ICD-10-CM

## 2019-02-28 DIAGNOSIS — Z794 Long term (current) use of insulin: Secondary | ICD-10-CM | POA: Insufficient documentation

## 2019-02-28 DIAGNOSIS — R1110 Vomiting, unspecified: Secondary | ICD-10-CM | POA: Diagnosis present

## 2019-02-28 LAB — CBC
HCT: 28.5 % — ABNORMAL LOW (ref 36.0–46.0)
Hemoglobin: 9.2 g/dL — ABNORMAL LOW (ref 12.0–15.0)
MCH: 31.3 pg (ref 26.0–34.0)
MCHC: 32.3 g/dL (ref 30.0–36.0)
MCV: 96.9 fL (ref 80.0–100.0)
Platelets: 145 10*3/uL — ABNORMAL LOW (ref 150–400)
RBC: 2.94 MIL/uL — ABNORMAL LOW (ref 3.87–5.11)
RDW: 12.8 % (ref 11.5–15.5)
WBC: 3.8 10*3/uL — ABNORMAL LOW (ref 4.0–10.5)
nRBC: 0 % (ref 0.0–0.2)

## 2019-02-28 LAB — LIPASE, BLOOD: Lipase: 21 U/L (ref 11–51)

## 2019-02-28 LAB — COMPREHENSIVE METABOLIC PANEL
ALT: 23 U/L (ref 0–44)
AST: 19 U/L (ref 15–41)
Albumin: 4.5 g/dL (ref 3.5–5.0)
Alkaline Phosphatase: 96 U/L (ref 38–126)
Anion gap: 12 (ref 5–15)
BUN: 14 mg/dL (ref 8–23)
CO2: 27 mmol/L (ref 22–32)
Calcium: 9.2 mg/dL (ref 8.9–10.3)
Chloride: 102 mmol/L (ref 98–111)
Creatinine, Ser: 0.87 mg/dL (ref 0.44–1.00)
GFR calc Af Amer: >60 mL/min (ref >60)
GFR calc non Af Amer: >60 mL/min (ref >60)
Glucose, Bld: 156 mg/dL — ABNORMAL HIGH (ref 70–99)
Potassium: 3.1 mmol/L — ABNORMAL LOW (ref 3.5–5.1)
Sodium: 141 mmol/L (ref 135–145)
Total Bilirubin: 0.5 mg/dL (ref 0.3–1.2)
Total Protein: 6.6 g/dL (ref 6.5–8.1)

## 2019-02-28 MED ORDER — SODIUM CHLORIDE 0.9 % IV BOLUS
1000.0000 mL | Freq: Once | INTRAVENOUS | Status: AC
  Administered 2019-02-28: 1000 mL via INTRAVENOUS

## 2019-02-28 MED ORDER — POTASSIUM CHLORIDE CRYS ER 20 MEQ PO TBCR
40.0000 meq | EXTENDED_RELEASE_TABLET | Freq: Once | ORAL | Status: AC
  Administered 2019-02-28: 40 meq via ORAL
  Filled 2019-02-28: qty 2

## 2019-02-28 MED ORDER — ONDANSETRON 4 MG PO TBDP: 4.0000 mg | ORAL_TABLET | Freq: Three times a day (TID) | ORAL | 0 refills | Status: AC | PRN

## 2019-02-28 NOTE — ED Notes (Signed)
Reports sister takes care of her and her sisters husband was dx with positive COVID and so was her sister. She wants tobe tested foe COVID while here to treat her nausea/ vomiting and headache.

## 2019-02-28 NOTE — ED Notes (Signed)
Awaiting md eval and plan of care.

## 2019-02-28 NOTE — Discharge Instructions (Signed)
Please follow-up with the primary care provider of your choice for symptoms that are not improving over the next couple of weeks.  Your COVID-19 test is being sent to an outside testing facility.  You will be notified of the results via telephone or on your MyChart.  Return to the emergency department for symptoms that change or worsen if unable to schedule an appointment.

## 2019-02-28 NOTE — ED Provider Notes (Signed)
[facility] Emergency Department Provider Note ___________________________________________   None    (approximate)  I have reviewed the triage vital signs and the nursing notes.   HISTORY  Chief Complaint Emesis  HPI Tasha Howard is a 68 y.o. female who presents to the emergency department for treatment and evaluation of   nausea and vomiting with headache.  Symptoms were present for 2 days, but she states she has had none today.  She is concerned that she is COVID positive since her son and sister-in-law that helps take care of her tested positive for COVID. She denies shortness of breath or chest pain/pressure.    Past Medical History:  Diagnosis Date  . Bone cancer ([place])   . Cancer ([place])   . Diabetic neuropathy ([place])   . DM (diabetes mellitus) ([place])   . DVT (deep vein thrombosis) in pregnancy   . Hypertension   . Stroke [facility])     Patient Active Problem List   Diagnosis Date Noted  . History of CVA (cerebrovascular accident)   . Left hemiplegia ([place])   . Advanced care planning/counseling discussion   . Goals of care, counseling/discussion   . Palliative care by specialist   . Pressure injury of skin 10/31/2018  . Protein-calorie malnutrition, severe 10/31/2018  . Sepsis ([place]) 10/30/2018    Past Surgical History:  Procedure Laterality Date  . ABDOMINAL HYSTERECTOMY      Prior to Admission medications   Medication Sig Start Date End Date Taking? Authorizing Provider  acetaminophen (TYLENOL) 325 MG tablet Take 650 mg by mouth every 6 (six) hours as needed for pain. 09/25/18   [provider]  albuterol (PROVENTIL HFA;VENTOLIN HFA) 108 (90 Base) MCG/ACT inhaler Inhale 2 puffs into the lungs every 6 (six) hours as needed for wheezing. 12/28/15   [provider]  apixaban (ELIQUIS) 2.5 MG TABS tablet Take 2.5 mg by mouth 2 (two) times daily. 09/25/18   [provider]  atorvastatin (LIPITOR) 80 MG tablet  Take 1 tablet (80 mg total) by mouth every evening for 30 days. 11/02/18 12/02/18  Salary, Avel Peace, MD  butalbital-acetaminophen-caffeine (FIORICET, ESGIC) [id number] MG tablet Take 1 tablet by mouth every 6 (six) hours as needed for headache. 09/25/18   [provider]  cetirizine (ZYRTEC) 10 MG tablet Take 10 mg by mouth daily. 03/28/17   [provider]  collagenase (SANTYL) ointment Apply topically daily. To affected area 11/03/18   Salary, Avel Peace, MD  dicyclomine (BENTYL) 20 MG tablet Take 1 tablet (20 mg total) by mouth every 6 (six) hours as needed. 01/17/19   Paulette Blanch, MD  DULoxetine (CYMBALTA) 60 MG capsule Take 60 mg by mouth daily. 09/26/18 11/15/18  [provider]  feeding supplement, ENSURE ENLIVE, (ENSURE ENLIVE) LIQD Take 237 mLs by mouth 2 (two) times daily between meals. 11/02/18   Salary, Avel Peace, MD  furosemide (LASIX) 20 MG tablet Take 20 mg by mouth daily. 04/17/18   [provider]  guaiFENesin (ROBITUSSIN) 100 MG/5ML SOLN Take 5 mLs (100 mg total) by mouth every 4 (four) hours as needed for cough or to loosen phlegm. 11/02/18   Salary, Holly Bodily D, MD  insulin glargine (LANTUS) 100 UNIT/ML injection Inject 6 Units into the skin Nightly. 09/25/18 10/30/18  [provider]  insulin lispro (HUMALOG) 100 UNIT/ML injection Inject 10 Units into the skin 3 (three) times daily with meals. Has been taking 3 units at meal times    [provider]  losartan (COZAAR) 25 MG tablet Take 12.5 mg by mouth daily. 09/26/18 10/30/18  [provider]  Melatonin 3 MG TABS Take 6 mg by mouth every evening. 09/25/18   [provider]  metoprolol tartrate (LOPRESSOR) 25 MG tablet Take 0.5 tablets (12.5 mg total) by mouth 2 (two) times daily. 11/02/18   Salary, Avel Peace, MD  mirtazapine (REMERON) 15 MG tablet Take 15 mg by mouth at bedtime.     [provider]  Multiple Minerals (MULTI-MINERALS PO) Take 1 tablet by mouth daily.  09/26/18   [provider]  nicotine (NICODERM CQ - DOSED IN MG/24 HOURS) 14 mg/24hr patch Place 1 patch (14 mg total) onto the skin daily. Patient not taking: Reported on 11/15/2018 11/03/18   Salary, Holly Bodily D, MD  nitroGLYCERIN (NITROLINGUAL) 0.4 MG/SPRAY spray Place 0.4 mg under the tongue every 5 (five) minutes x 3 doses as needed for chest pain.  07/12/18 07/12/19  [provider]  ondansetron (ZOFRAN-ODT) 4 MG disintegrating tablet Take 1 tablet (4 mg total) by mouth every 8 (eight) hours as needed for nausea or vomiting. 02/28/19   Hermleigh Cohick B, FNP  oxyCODONE-acetaminophen (PERCOCET) 7.5-325 MG tablet Take 1 tablet by mouth every 8 (eight) hours as needed for pain. 08/30/18   [provider]  potassium chloride SA (KLOR-CON M20) 20 MEQ tablet Take 1 tablet (20 mEq total) by mouth daily. 11/19/18   Hinda Kehr, MD  pregabalin (LYRICA) 75 MG capsule Take 75 mg by mouth 2 (two) times daily. 08/30/18 11/28/18  [provider]  thiamine 100 MG tablet Take 200 mg by mouth 2 (two) times daily. 09/25/18 09/25/19  [provider]  valACYclovir (VALTREX) 500 MG tablet Take 500 mg by mouth daily. 06/03/18   [provider]  vitamin C (ASCORBIC ACID) 500 MG tablet Take 500 mg by mouth 2 (two) times daily.    [provider]  zinc sulfate 220 (50 Zn) MG capsule Take 220 mg by mouth daily.    [provider]    Allergies Metformin and related  Family History  Problem Relation Age of Onset  . Cancer Mother   . Diabetes Mother   . Heart disease Mother   . Cancer Father     Social History Social History   Tobacco Use  . Smoking status: Current Every Day Smoker    Packs/day: 0.75    Types: Cigarettes  . Smokeless tobacco: Never Used  Substance Use Topics  . Alcohol use: Not Currently  . Drug use: Not Currently    Review of Systems  Constitutional: No fever/ positive for chills Eyes: No visual changes. ENT: No sore  throat. Cardiovascular: Denies chest pain. Respiratory: Denies shortness of breath. Gastrointestinal: No abdominal pain.  Positive for nausea and vomiting. no diarrhea.  No constipation. Genitourinary: Negative for dysuria. Musculoskeletal: Negative for back pain. Skin: Negative for rash. Neurological: Positive for headache, negative for focal weakness or numbness. ____________________________________________   PHYSICAL EXAM:  VITAL SIGNS: ED Triage Vitals [02/28/19 1230]  Enc Vitals Group     BP 137/79     Pulse Rate 96     Resp 18     Temp 98.7 F (37.1 C)     Temp Source Oral     SpO2 99 %     Weight 118 lb (53.5 kg)     Height 5\' 6"  (1.676 m)     Head Circumference      Peak Flow  Pain Score 0     Pain Loc      Pain Edu?      Excl. in [place]?     Constitutional: Alert and oriented. Well appearing and in no acute distress. Eyes: Conjunctivae are normal. PERRL. EOMI. Head: Atraumatic. Nose: No congestion/rhinnorhea. Mouth/Throat: Mucous membranes are moist.  Oropharynx non-erythematous. Neck: No stridor.   Cardiovascular: Normal rate, regular rhythm. Grossly normal heart sounds.  Good peripheral circulation. Respiratory: Normal respiratory effort.  No retractions. Lungs CTAB. Gastrointestinal: Soft and nontender. No distention. No abdominal bruits. No CVA tenderness. Musculoskeletal: No lower extremity tenderness nor edema.  No joint effusions. Neurologic:  Normal speech and language. No gross focal neurologic deficits are appreciated. No gait instability. Skin:  Skin is warm, dry and intact. No rash noted. Psychiatric: Mood and affect are normal. Speech and behavior are normal.  ____________________________________________   LABS (all labs ordered are listed, but only abnormal results are displayed)  Labs Reviewed  COMPREHENSIVE METABOLIC PANEL - Abnormal; Notable for the following components:      Result Value   Potassium 3.1 (*)    Glucose, Bld 156 (*)     All other components within normal limits  CBC - Abnormal; Notable for the following components:   WBC 3.8 (*)    RBC 2.94 (*)    Hemoglobin 9.2 (*)    HCT 28.5 (*)    Platelets 145 (*)    All other components within normal limits  NOVEL CORONAVIRUS, NAA (HOSPITAL ORDER, SEND-OUT TO REF LAB)  LIPASE, BLOOD  URINALYSIS, COMPLETE (UACMP) WITH MICROSCOPIC   ____________________________________________  EKG  Not indicated. ____________________________________________  RADIOLOGY  ED MD interpretation: No acute abnormality on chest x-ray.  She does have some mild gaseous distention of the colon but otherwise no evidence of free air or other concerns..  Official radiology report(s): No results found.  ____________________________________________   PROCEDURES  Procedure(s) performed: None  Procedures  Critical Care performed: No  ____________________________________________   INITIAL IMPRESSION / ASSESSMENT AND PLAN / ED COURSE     68 year old female exposed to COVID-19 presents to the emergency department after having nausea and vomiting with headache.  While here, COVID-19 swab was sent and should result within the next couple of days.  Because she has been exposed, she is aware that she will need to quarantine.  During her visit, she was given IV fluids, Zofran, and some potassium with relief of nausea and headache.  Upon discharge, she will be given a prescription for Zofran and encouraged to do clear liquid diet and progress as tolerated.  Patient agreeable to the above and eats that she is ready for discharge.      ____________________________________________   FINAL CLINICAL IMPRESSION(S) / ED DIAGNOSES  Final diagnoses:  Hypokalemia  Acute viral syndrome  Exposure to Covid-19 Virus     ED Discharge Orders         Ordered    ondansetron (ZOFRAN-ODT) 4 MG disintegrating tablet  Every 8 hours PRN     02/28/19 1733           Note:  This document was  prepared using Dragon voice recognition software and may include unintentional dictation errors.    Victorino Dike, FNP 02/28/19 1745    Nena Polio, MD 02/28/19 2249

## 2019-02-28 NOTE — ED Notes (Signed)
First Nurse Note: Pt to ED via [facility] from home. Pt has had positive COVID exposure. Pt is having N/V. Pt is in NAD

## 2019-02-28 NOTE — ED Triage Notes (Signed)
Pt to ED from home c/o headache and vomiting x2 days, none today.  Pt states is also concerned for COVID, states sister-in-law comes to take care of her and she and her husband tested positive COVID.  Pt denies SOB, pt states lost sense of taste 2 days ago.  Chest rise even and unlabored, speaking in complete and coherent sentences, in NAD at this time in triage.

## 2019-03-01 LAB — NOVEL CORONAVIRUS, NAA (HOSP ORDER, SEND-OUT TO REF LAB; TAT 18-24 HRS): SARS-CoV-2, NAA: NOT DETECTED

## 2019-03-03 ENCOUNTER — Telehealth: Payer: Self-pay | Admitting: Emergency Medicine

## 2019-03-03 NOTE — Telephone Encounter (Signed)
pateint called asking for covid test results.  I gave her negative results.  She says she has been having headache and diarrhea. I told her to follow up with her pcp as needed.

## 2019-03-26 ENCOUNTER — Other Ambulatory Visit: Payer: Self-pay

## 2019-03-26 ENCOUNTER — Emergency Department
Admission: EM | Admit: 2019-03-26 | Discharge: 2019-03-26 | Disposition: A | Discharge status: Home / Self Care | Payer: Medicare HMO | Attending: Emergency Medicine | Admitting: Emergency Medicine

## 2019-03-26 ENCOUNTER — Encounter: Payer: Self-pay | Admitting: Emergency Medicine

## 2019-03-26 DIAGNOSIS — R531 Weakness: Secondary | ICD-10-CM | POA: Diagnosis present

## 2019-03-26 DIAGNOSIS — E860 Dehydration: Secondary | ICD-10-CM | POA: Diagnosis not present

## 2019-03-26 DIAGNOSIS — R112 Nausea with vomiting, unspecified: Secondary | ICD-10-CM | POA: Insufficient documentation

## 2019-03-26 DIAGNOSIS — I10 Essential (primary) hypertension: Secondary | ICD-10-CM | POA: Diagnosis not present

## 2019-03-26 DIAGNOSIS — Z85830 Personal history of malignant neoplasm of bone: Secondary | ICD-10-CM | POA: Insufficient documentation

## 2019-03-26 DIAGNOSIS — F17210 Nicotine dependence, cigarettes, uncomplicated: Secondary | ICD-10-CM | POA: Insufficient documentation

## 2019-03-26 DIAGNOSIS — Z8673 Personal history of transient ischemic attack (TIA), and cerebral infarction without residual deficits: Secondary | ICD-10-CM | POA: Insufficient documentation

## 2019-03-26 DIAGNOSIS — R197 Diarrhea, unspecified: Secondary | ICD-10-CM | POA: Insufficient documentation

## 2019-03-26 DIAGNOSIS — E1140 Type 2 diabetes mellitus with diabetic neuropathy, unspecified: Secondary | ICD-10-CM | POA: Diagnosis not present

## 2019-03-26 DIAGNOSIS — E876 Hypokalemia: Secondary | ICD-10-CM | POA: Diagnosis not present

## 2019-03-26 LAB — BASIC METABOLIC PANEL
Anion gap: 14 (ref 5–15)
BUN: 28 mg/dL — ABNORMAL HIGH (ref 8–23)
CO2: 26 mmol/L (ref 22–32)
Calcium: 8.7 mg/dL — ABNORMAL LOW (ref 8.9–10.3)
Chloride: 97 mmol/L — ABNORMAL LOW (ref 98–111)
Creatinine, Ser: 1.5 mg/dL — ABNORMAL HIGH (ref 0.44–1.00)
GFR calc Af Amer: 41 mL/min — ABNORMAL LOW (ref >60)
GFR calc non Af Amer: 35 mL/min — ABNORMAL LOW (ref >60)
Glucose, Bld: 298 mg/dL — ABNORMAL HIGH (ref 70–99)
Potassium: 2.6 mmol/L — CL (ref 3.5–5.1)
Sodium: 137 mmol/L (ref 135–145)

## 2019-03-26 LAB — CBC
HCT: 27.5 % — ABNORMAL LOW (ref 36.0–46.0)
Hemoglobin: 9.1 g/dL — ABNORMAL LOW (ref 12.0–15.0)
MCH: 31.1 pg (ref 26.0–34.0)
MCHC: 33.1 g/dL (ref 30.0–36.0)
MCV: 93.9 fL (ref 80.0–100.0)
Platelets: 157 10*3/uL (ref 150–400)
RBC: 2.93 MIL/uL — ABNORMAL LOW (ref 3.87–5.11)
RDW: 13.1 % (ref 11.5–15.5)
WBC: 4.7 10*3/uL (ref 4.0–10.5)
nRBC: 0 % (ref 0.0–0.2)

## 2019-03-26 MED ORDER — SODIUM CHLORIDE 0.9% FLUSH: 3.0000 mL | Freq: Once | INTRAVENOUS | Status: DC

## 2019-03-26 MED ORDER — POTASSIUM CHLORIDE CRYS ER 20 MEQ PO TBCR
40.0000 meq | EXTENDED_RELEASE_TABLET | Freq: Once | ORAL | Status: AC
  Administered 2019-03-26: 17:00:00 40 meq via ORAL
  Filled 2019-03-26: qty 2

## 2019-03-26 MED ORDER — SODIUM CHLORIDE 0.9 % IV BOLUS
1000.0000 mL | Freq: Once | INTRAVENOUS | Status: AC
  Administered 2019-03-26: 1000 mL via INTRAVENOUS

## 2019-03-26 MED ORDER — ONDANSETRON HCL 4 MG/2ML IJ SOLN
4.0000 mg | Freq: Once | INTRAMUSCULAR | Status: AC
  Administered 2019-03-26: 4 mg via INTRAVENOUS
  Filled 2019-03-26: qty 2

## 2019-03-26 MED ORDER — ONDANSETRON HCL 4 MG PO TABS: 4.0000 mg | ORAL_TABLET | Freq: Three times a day (TID) | ORAL | 0 refills | Status: AC | PRN

## 2019-03-26 MED ORDER — POTASSIUM CHLORIDE CRYS ER 20 MEQ PO TBCR
40.0000 meq | EXTENDED_RELEASE_TABLET | Freq: Once | ORAL | Status: AC
  Administered 2019-03-26: 40 meq via ORAL
  Filled 2019-03-26: qty 2

## 2019-03-26 NOTE — ED Notes (Signed)
Date and time results received: 03/26/19 4:38 PM (use smartphrase ".now" to insert current time)  Test: Potassium Critical Value: 2.6  Name of Provider Notified: Archie Balboa, MD

## 2019-03-26 NOTE — Discharge Instructions (Signed)
Please talk to your primary care physician to have your blood work rechecked in the next 2 days. They need to check for further signs of dehydration, kidney function and electrolytes. Please seek medical attention for any high fevers, chest pain, shortness of breath, change in behavior, persistent vomiting, bloody stool or any other new or concerning symptoms.

## 2019-03-26 NOTE — ED Triage Notes (Signed)
PT to ER via EMS from home with generalized weakness for last 2 says increasing.  Pt reports n/v/d for last week.

## 2019-03-26 NOTE — ED Provider Notes (Signed)
[facility] Emergency Department Provider Note  ____________________________________________   I have reviewed the triage vital signs and the nursing notes.   HISTORY  Chief Complaint Weakness   History limited by: Not Limited   HPI Tasha Howard is a 68 y.o. female who presents to the emergency department today because of concerns for weakness, nausea vomiting and some diarrhea.  The patient states the symptoms have been present for roughly 1 week.  She denies any blood in her vomit or diarrhea.  She denies any associated abdominal pain.  She does state that her oral intake has been decreased.  She states that she frequently gets that the symptoms.  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  Patient denies any recent fevers.    Records reviewed. Per medical record review patient has a history of left hemiplegia  Past Medical History:  Diagnosis Date  . Bone cancer ([place])   . Cancer ([place])   . Diabetic neuropathy ([place])   . DM (diabetes mellitus) ([place])   . DVT (deep vein thrombosis) in pregnancy   . Hypertension   . Stroke [facility])     Patient Active Problem List   Diagnosis Date Noted  . History of CVA (cerebrovascular accident)   . Left hemiplegia ([place])   . Advanced care planning/counseling discussion   . Goals of care, counseling/discussion   . Palliative care by specialist   . Pressure injury of skin 10/31/2018  . Protein-calorie malnutrition, severe 10/31/2018  . Sepsis ([place]) 10/30/2018    Past Surgical History:  Procedure Laterality Date  . ABDOMINAL HYSTERECTOMY      Prior to Admission medications   Medication Sig Start Date End Date Taking? Authorizing Provider  acetaminophen (TYLENOL) 325 MG tablet Take 650 mg by mouth every 6 (six) hours as needed for pain. 09/25/18   [provider]  albuterol (PROVENTIL HFA;VENTOLIN HFA) 108 (90 Base) MCG/ACT inhaler Inhale 2 puffs into  the lungs every 6 (six) hours as needed for wheezing. 12/28/15   [provider]  apixaban (ELIQUIS) 2.5 MG TABS tablet Take 2.5 mg by mouth 2 (two) times daily. 09/25/18   [provider]  atorvastatin (LIPITOR) 80 MG tablet Take 1 tablet (80 mg total) by mouth every evening for 30 days. 11/02/18 12/02/18  Salary, Avel Peace, MD  butalbital-acetaminophen-caffeine (FIORICET, ESGIC) [phone] MG tablet Take 1 tablet by mouth every 6 (six) hours as needed for headache. 09/25/18   [provider]  cetirizine (ZYRTEC) 10 MG tablet Take 10 mg by mouth daily. 03/28/17   [provider]  collagenase (SANTYL) ointment Apply topically daily. To affected area 11/03/18   Salary, Avel Peace, MD  dicyclomine (BENTYL) 20 MG tablet Take 1 tablet (20 mg total) by mouth every 6 (six) hours as needed. 01/17/19   Paulette Blanch, MD  DULoxetine (CYMBALTA) 60 MG capsule Take 60 mg by mouth daily. 09/26/18 11/15/18  [provider]  feeding supplement, ENSURE ENLIVE, (ENSURE ENLIVE) LIQD Take 237 mLs by mouth 2 (two) times daily between meals. 11/02/18   Salary, Avel Peace, MD  furosemide (LASIX) 20 MG tablet Take 20 mg by mouth daily. 04/17/18   [provider]  guaiFENesin (ROBITUSSIN) 100 MG/5ML SOLN Take 5 mLs (100 mg total) by mouth every 4 (four) hours as needed for cough or to loosen phlegm. 11/02/18   Salary, Holly Bodily D, MD  insulin glargine (LANTUS) 100 UNIT/ML injection Inject 6 Units into  the skin Nightly. 09/25/18 10/30/18  [provider]  insulin lispro (HUMALOG) 100 UNIT/ML injection Inject 10 Units into the skin 3 (three) times daily with meals. Has been taking 3 units at meal times    [provider]  losartan (COZAAR) 25 MG tablet Take 12.5 mg by mouth daily. 09/26/18 10/30/18  [provider]  Melatonin 3 MG TABS Take 6 mg by mouth every evening. 09/25/18   [provider]  metoprolol tartrate (LOPRESSOR) 25 MG tablet Take 0.5 tablets (12.5  mg total) by mouth 2 (two) times daily. 11/02/18   Salary, Avel Peace, MD  mirtazapine (REMERON) 15 MG tablet Take 15 mg by mouth at bedtime.     [provider]  Multiple Minerals (MULTI-MINERALS PO) Take 1 tablet by mouth daily. 09/26/18   [provider]  nicotine (NICODERM CQ - DOSED IN MG/24 HOURS) 14 mg/24hr patch Place 1 patch (14 mg total) onto the skin daily. Patient not taking: Reported on 11/15/2018 11/03/18   Salary, Holly Bodily D, MD  nitroGLYCERIN (NITROLINGUAL) 0.4 MG/SPRAY spray Place 0.4 mg under the tongue every 5 (five) minutes x 3 doses as needed for chest pain.  07/12/18 07/12/19  [provider]  ondansetron (ZOFRAN-ODT) 4 MG disintegrating tablet Take 1 tablet (4 mg total) by mouth every 8 (eight) hours as needed for nausea or vomiting. 02/28/19   Triplett, Cari B, FNP  oxyCODONE-acetaminophen (PERCOCET) 7.5-325 MG tablet Take 1 tablet by mouth every 8 (eight) hours as needed for pain. 08/30/18   [provider]  potassium chloride SA (KLOR-CON M20) 20 MEQ tablet Take 1 tablet (20 mEq total) by mouth daily. 11/19/18   Hinda Kehr, MD  pregabalin (LYRICA) 75 MG capsule Take 75 mg by mouth 2 (two) times daily. 08/30/18 11/28/18  [provider]  thiamine 100 MG tablet Take 200 mg by mouth 2 (two) times daily. 09/25/18 09/25/19  [provider]  valACYclovir (VALTREX) 500 MG tablet Take 500 mg by mouth daily. 06/03/18   [provider]  vitamin C (ASCORBIC ACID) 500 MG tablet Take 500 mg by mouth 2 (two) times daily.    [provider]  zinc sulfate 220 (50 Zn) MG capsule Take 220 mg by mouth daily.    [provider]    Allergies Metformin and related  Family History  Problem Relation Age of Onset  . Cancer Mother   . Diabetes Mother   . Heart disease Mother   . Cancer Father     Social History Social History   Tobacco Use  . Smoking status: Current Every Day Smoker    Packs/day: 0.75    Types:  Cigarettes  . Smokeless tobacco: Never Used  Substance Use Topics  . Alcohol use: Not Currently  . Drug use: Not Currently    Review of Systems Constitutional: No fever/chills Eyes: No visual changes. ENT: No sore throat. Cardiovascular: Denies chest pain. Respiratory: Denies shortness of breath. Gastrointestinal: No abdominal pain.  Positive for nausea, vomiting and diarrhea.   Genitourinary: Negative for dysuria. Musculoskeletal: Negative for back pain. Skin: Negative for rash. Neurological: Negative for headaches, focal weakness or numbness.  ____________________________________________   PHYSICAL EXAM:  VITAL SIGNS: ED Triage Vitals  Enc Vitals Group     BP 03/26/19 1605 130/68     Pulse Rate 03/26/19 1605 (!) 101     Resp 03/26/19 1605 19     Temp --      Temp src --  SpO2 03/26/19 1605 99 %     Weight 03/26/19 1609 117 lb 15.1 oz (53.5 kg)     Height 03/26/19 1609 5\' 7"  (1.702 m)   Constitutional: Alert and oriented.  Eyes: Conjunctivae are normal.  ENT      Head: Normocephalic and atraumatic.      Nose: No congestion/rhinnorhea.      Mouth/Throat: Mucous membranes are moist.      Neck: No stridor. Hematological/Lymphatic/Immunilogical: No cervical lymphadenopathy. Cardiovascular: Normal rate, regular rhythm.  No murmurs, rubs, or gallops.  Respiratory: Normal respiratory effort without tachypnea nor retractions. Breath sounds are clear and equal bilaterally. No wheezes/rales/rhonchi. Gastrointestinal: Soft and non tender. No rebound. No guarding.  Genitourinary: Deferred Musculoskeletal: Normal range of motion in all extremities. No lower extremity edema. Neurologic:  Normal speech and language. Left sided weakness in upper and lower extremity which patient states is baseline for her.  Skin:  Skin is warm, dry and intact. No rash noted. Psychiatric: Mood and affect are normal. Speech and behavior are normal. Patient exhibits appropriate insight and  judgment.  ____________________________________________    LABS (pertinent positives/negatives)  CBC wbc 4.7, hgb 9.1, plt 157 BMP na 137, k 2.6, glu 298, cr 1.50  ____________________________________________   EKG  I, Nance Pear, attending physician, personally viewed and interpreted this EKG  EKG Time: 1602 Rate: 101 Rhythm: sinus tachycardia Axis: left axis deviation Intervals: qtc 431 QRS: LVH ST changes: no st elevation Impression: abnormal ekg   ____________________________________________    RADIOLOGY  None  ____________________________________________   PROCEDURES  Procedures  ____________________________________________   INITIAL IMPRESSION / ASSESSMENT AND PLAN / ED COURSE  Pertinent labs & imaging results that were available during my care of the patient were reviewed by me and considered in my medical decision making (see chart for details).   Patient presented to the emergency department today because of nausea vomiting and diarrhea.  This is accompanied by weakness.  Blood work is notable for hypokalemia as well as elevated creatinine.  I discussed these findings with the patient.  No leukocytosis or evidence of significant intra-abdominal pathology on exam.  Patient did feel better after fluids and potassium.  She did request discharge.  I think this is reasonable.  I did discuss with patient importance of close primary care follow-up and lab recheck.  Will provide patient with prescription for nausea medication.  Discussed importance of oral hydration.   ____________________________________________   FINAL CLINICAL IMPRESSION(S) / ED DIAGNOSES  Final diagnoses:  Dehydration  Hypokalemia     Note: This dictation was prepared with Dragon dictation. Any transcriptional errors that result from this process are unintentional     Nance Pear, MD 03/26/19 2014

## 2019-04-08 ENCOUNTER — Telehealth: Payer: Self-pay | Admitting: Student

## 2019-04-08 NOTE — Telephone Encounter (Signed)
Palliative NP called patient to schedule a follow up appointment. Visit scheduled for 8/7 at 1pm.

## 2019-04-11 ENCOUNTER — Telehealth: Payer: Self-pay | Admitting: Student

## 2019-04-11 NOTE — Telephone Encounter (Signed)
Palliative NP attempted to reach patient for required screening for in person visit scheduled today. No answer and voicemail is full.

## 2019-04-23 ENCOUNTER — Emergency Department
Admission: EM | Admit: 2019-04-23 | Discharge: 2019-04-23 | Disposition: A | Discharge status: Home / Self Care | Payer: Medicare HMO | Attending: Student in an Organized Health Care Education/Training Program | Admitting: Student in an Organized Health Care Education/Training Program

## 2019-04-23 ENCOUNTER — Emergency Department: Payer: Medicare HMO

## 2019-04-23 ENCOUNTER — Other Ambulatory Visit: Payer: Self-pay

## 2019-04-23 DIAGNOSIS — R531 Weakness: Secondary | ICD-10-CM | POA: Insufficient documentation

## 2019-04-23 DIAGNOSIS — Z79899 Other long term (current) drug therapy: Secondary | ICD-10-CM | POA: Diagnosis not present

## 2019-04-23 DIAGNOSIS — Z7901 Long term (current) use of anticoagulants: Secondary | ICD-10-CM | POA: Diagnosis not present

## 2019-04-23 DIAGNOSIS — Z794 Long term (current) use of insulin: Secondary | ICD-10-CM | POA: Insufficient documentation

## 2019-04-23 DIAGNOSIS — F17210 Nicotine dependence, cigarettes, uncomplicated: Secondary | ICD-10-CM | POA: Diagnosis not present

## 2019-04-23 DIAGNOSIS — Z5189 Encounter for other specified aftercare: Secondary | ICD-10-CM

## 2019-04-23 DIAGNOSIS — I10 Essential (primary) hypertension: Secondary | ICD-10-CM | POA: Insufficient documentation

## 2019-04-23 DIAGNOSIS — Z85830 Personal history of malignant neoplasm of bone: Secondary | ICD-10-CM | POA: Diagnosis not present

## 2019-04-23 DIAGNOSIS — L089 Local infection of the skin and subcutaneous tissue, unspecified: Secondary | ICD-10-CM | POA: Insufficient documentation

## 2019-04-23 DIAGNOSIS — E1140 Type 2 diabetes mellitus with diabetic neuropathy, unspecified: Secondary | ICD-10-CM | POA: Insufficient documentation

## 2019-04-23 DIAGNOSIS — Z8673 Personal history of transient ischemic attack (TIA), and cerebral infarction without residual deficits: Secondary | ICD-10-CM | POA: Insufficient documentation

## 2019-04-23 LAB — URINALYSIS, COMPLETE (UACMP) WITH MICROSCOPIC
Bilirubin Urine: NEGATIVE
Glucose, UA: NEGATIVE mg/dL
Hgb urine dipstick: NEGATIVE
Ketones, ur: NEGATIVE mg/dL
Leukocytes,Ua: NEGATIVE
Nitrite: NEGATIVE
Protein, ur: 30 mg/dL — AB
Specific Gravity, Urine: 1.011 (ref 1.005–1.030)
Squamous Epithelial / HPF: NONE SEEN (ref 0–5)
pH: 6 (ref 5.0–8.0)

## 2019-04-23 LAB — CBC WITH DIFFERENTIAL/PLATELET
Abs Immature Granulocytes: 0.01 10*3/uL (ref 0.00–0.07)
Basophils Absolute: 0 10*3/uL (ref 0.0–0.1)
Basophils Relative: 1 %
Eosinophils Absolute: 0 10*3/uL (ref 0.0–0.5)
Eosinophils Relative: 1 %
HCT: 26.2 % — ABNORMAL LOW (ref 36.0–46.0)
Hemoglobin: 8.3 g/dL — ABNORMAL LOW (ref 12.0–15.0)
Immature Granulocytes: 0 %
Lymphocytes Relative: 25 %
Lymphs Abs: 1.4 10*3/uL (ref 0.7–4.0)
MCH: 31 pg (ref 26.0–34.0)
MCHC: 31.7 g/dL (ref 30.0–36.0)
MCV: 97.8 fL (ref 80.0–100.0)
Monocytes Absolute: 0.4 10*3/uL (ref 0.1–1.0)
Monocytes Relative: 7 %
Neutro Abs: 3.7 10*3/uL (ref 1.7–7.7)
Neutrophils Relative %: 66 %
Platelets: 140 10*3/uL — ABNORMAL LOW (ref 150–400)
RBC: 2.68 MIL/uL — ABNORMAL LOW (ref 3.87–5.11)
RDW: 15.9 % — ABNORMAL HIGH (ref 11.5–15.5)
WBC: 5.6 10*3/uL (ref 4.0–10.5)
nRBC: 0 % (ref 0.0–0.2)

## 2019-04-23 LAB — COMPREHENSIVE METABOLIC PANEL
ALT: 11 U/L (ref 0–44)
AST: 21 U/L (ref 15–41)
Albumin: 3.7 g/dL (ref 3.5–5.0)
Alkaline Phosphatase: 84 U/L (ref 38–126)
Anion gap: 5 (ref 5–15)
BUN: 9 mg/dL (ref 8–23)
CO2: 25 mmol/L (ref 22–32)
Calcium: 8.3 mg/dL — ABNORMAL LOW (ref 8.9–10.3)
Chloride: 107 mmol/L (ref 98–111)
Creatinine, Ser: 0.89 mg/dL (ref 0.44–1.00)
GFR calc Af Amer: >60 mL/min (ref >60)
GFR calc non Af Amer: >60 mL/min (ref >60)
Glucose, Bld: 122 mg/dL — ABNORMAL HIGH (ref 70–99)
Potassium: 3.7 mmol/L (ref 3.5–5.1)
Sodium: 137 mmol/L (ref 135–145)
Total Bilirubin: 0.4 mg/dL (ref 0.3–1.2)
Total Protein: 6 g/dL — ABNORMAL LOW (ref 6.5–8.1)

## 2019-04-23 MED ORDER — DOXYCYCLINE HYCLATE 100 MG PO TABS: 100.0000 mg | ORAL_TABLET | Freq: Two times a day (BID) | ORAL | 0 refills | Status: AC

## 2019-04-23 NOTE — ED Notes (Signed)
PT called son to pick her up who requested to speak to the nurse. This rn answered the phone and son states "last time she said she was ready to be picked up she wasn't ready". Assured pt's son that this RN was working on d/c at this time to [place] son asked what we did with her foot. Explained that a new dressing was applied and went over after care instructions. Son insists that foot is infected, this RN explained that MD and RN looked at foot and did not believe it was infected at this time. Son states he will take her to [place] and hung up.

## 2019-04-23 NOTE — ED Provider Notes (Signed)
[facility] Emergency Department Provider Note    First MD Initiated Contact with Patient 04/23/19 1500     (approximate)  I have reviewed the triage vital signs and the nursing notes.   HISTORY  Chief Complaint Weakness    HPI Tasha Howard is a 68 y.o. female presents the ER for evaluation of "a whole lot of things. "  Patient was reportedly sent to the ER after home health nurse checked on her and found her temperature low and was concern for infection of the left heel.  Patient denies any pain.  Denies any nausea or vomiting.  No chest pain or shortness of breath.  Denies any fatigue.    Past Medical History:  Diagnosis Date  . Bone cancer ([place])   . Cancer ([place])   . Diabetic neuropathy ([place])   . DM (diabetes mellitus) ([place])   . DVT (deep vein thrombosis) in pregnancy   . Hypertension   . Stroke [facility])    Family History  Problem Relation Age of Onset  . Cancer Mother   . Diabetes Mother   . Heart disease Mother   . Cancer Father    Past Surgical History:  Procedure Laterality Date  . ABDOMINAL HYSTERECTOMY     Patient Active Problem List   Diagnosis Date Noted  . History of CVA (cerebrovascular accident)   . Left hemiplegia ([place])   . Advanced care planning/counseling discussion   . Goals of care, counseling/discussion   . Palliative care by specialist   . Pressure injury of skin 10/31/2018  . Protein-calorie malnutrition, severe 10/31/2018  . Sepsis ([place]) 10/30/2018      Prior to Admission medications   Medication Sig Start Date End Date Taking? Authorizing Provider  acetaminophen (TYLENOL) 325 MG tablet Take 650 mg by mouth every 6 (six) hours as needed for pain. 09/25/18   [provider]  albuterol (PROVENTIL HFA;VENTOLIN HFA) 108 (90 Base) MCG/ACT inhaler Inhale 2 puffs into the lungs every 6 (six) hours as needed for wheezing. 12/28/15   [provider]  apixaban (ELIQUIS) 2.5 MG TABS tablet Take  2.5 mg by mouth 2 (two) times daily. 09/25/18   [provider]  atorvastatin (LIPITOR) 80 MG tablet Take 1 tablet (80 mg total) by mouth every evening for 30 days. 11/02/18 12/02/18  Salary, Avel Peace, MD  butalbital-acetaminophen-caffeine (FIORICET, ESGIC) [phone] MG tablet Take 1 tablet by mouth every 6 (six) hours as needed for headache. 09/25/18   [provider]  cetirizine (ZYRTEC) 10 MG tablet Take 10 mg by mouth daily. 03/28/17   [provider]  collagenase (SANTYL) ointment Apply topically daily. To affected area 11/03/18   Salary, Avel Peace, MD  dicyclomine (BENTYL) 20 MG tablet Take 1 tablet (20 mg total) by mouth every 6 (six) hours as needed. 01/17/19   Paulette Blanch, MD  DULoxetine (CYMBALTA) 60 MG capsule Take 60 mg by mouth daily. 09/26/18 11/15/18  [provider]  feeding supplement, ENSURE ENLIVE, (ENSURE ENLIVE) LIQD Take 237 mLs by mouth 2 (two) times daily between meals. 11/02/18   Salary, Avel Peace, MD  furosemide (LASIX) 20 MG tablet Take 20 mg by mouth daily. 04/17/18   [provider]  guaiFENesin (ROBITUSSIN) 100 MG/5ML SOLN Take 5 mLs (100 mg total) by mouth every 4 (four) hours as needed for cough or to loosen phlegm. 11/02/18   Salary, Holly Bodily D, MD  insulin glargine (LANTUS) 100 UNIT/ML injection Inject 6 Units into  the skin Nightly. 09/25/18 10/30/18  [provider]  insulin lispro (HUMALOG) 100 UNIT/ML injection Inject 10 Units into the skin 3 (three) times daily with meals. Has been taking 3 units at meal times    [provider]  losartan (COZAAR) 25 MG tablet Take 12.5 mg by mouth daily. 09/26/18 10/30/18  [provider]  Melatonin 3 MG TABS Take 6 mg by mouth every evening. 09/25/18   [provider]  metoprolol tartrate (LOPRESSOR) 25 MG tablet Take 0.5 tablets (12.5 mg total) by mouth 2 (two) times daily. 11/02/18   Salary, Avel Peace, MD  mirtazapine (REMERON) 15 MG tablet Take 15 mg by mouth at  bedtime.     [provider]  Multiple Minerals (MULTI-MINERALS PO) Take 1 tablet by mouth daily. 09/26/18   [provider]  nicotine (NICODERM CQ - DOSED IN MG/24 HOURS) 14 mg/24hr patch Place 1 patch (14 mg total) onto the skin daily. Patient not taking: Reported on 11/15/2018 11/03/18   Salary, Holly Bodily D, MD  nitroGLYCERIN (NITROLINGUAL) 0.4 MG/SPRAY spray Place 0.4 mg under the tongue every 5 (five) minutes x 3 doses as needed for chest pain.  07/12/18 07/12/19  [provider]  ondansetron (ZOFRAN) 4 MG tablet Take 1 tablet (4 mg total) by mouth every 8 (eight) hours as needed. 03/26/19   Nance Pear, MD  ondansetron (ZOFRAN-ODT) 4 MG disintegrating tablet Take 1 tablet (4 mg total) by mouth every 8 (eight) hours as needed for nausea or vomiting. 02/28/19   Triplett, Cari B, FNP  oxyCODONE-acetaminophen (PERCOCET) 7.5-325 MG tablet Take 1 tablet by mouth every 8 (eight) hours as needed for pain. 08/30/18   [provider]  potassium chloride SA (KLOR-CON M20) 20 MEQ tablet Take 1 tablet (20 mEq total) by mouth daily. 11/19/18   Hinda Kehr, MD  pregabalin (LYRICA) 75 MG capsule Take 75 mg by mouth 2 (two) times daily. 08/30/18 11/28/18  [provider]  thiamine 100 MG tablet Take 200 mg by mouth 2 (two) times daily. 09/25/18 09/25/19  [provider]  valACYclovir (VALTREX) 500 MG tablet Take 500 mg by mouth daily. 06/03/18   [provider]  vitamin C (ASCORBIC ACID) 500 MG tablet Take 500 mg by mouth 2 (two) times daily.    [provider]  zinc sulfate 220 (50 Zn) MG capsule Take 220 mg by mouth daily.    [provider]    Allergies Metformin and related    Social History Social History   Tobacco Use  . Smoking status: Current Every Day Smoker    Packs/day: 0.75    Types: Cigarettes  . Smokeless tobacco: Never Used  Substance Use Topics  . Alcohol use: Not Currently  . Drug use: Not Currently     Review of Systems Patient denies headaches, rhinorrhea, blurry vision, numbness, shortness of breath, chest pain, edema, cough, abdominal pain, nausea, vomiting, diarrhea, dysuria, fevers, rashes or hallucinations unless otherwise stated above in HPI. ____________________________________________   PHYSICAL EXAM:  VITAL SIGNS: Vitals:   04/23/19 1500  BP: 104/87  Pulse: 73  Resp: 17  Temp: 98.6 F (37 C)  SpO2: 97%    Constitutional: Alert and oriented.  Eyes: Conjunctivae are normal.  Head: Atraumatic. Nose: No congestion/rhinnorhea. Mouth/Throat: Mucous membranes are moist.   Neck: No stridor. Painless ROM.  Cardiovascular: Normal rate, regular rhythm. Grossly normal heart sounds.  Good peripheral circulation. Respiratory: Normal respiratory effort.  No retractions. Lungs CTAB. Gastrointestinal: Soft and nontender.  No distention. No abdominal bruits. No CVA tenderness. Genitourinary: deferred Musculoskeletal: No lower extremity tenderness nor edema.  Strong DP and PT pulses.  There is an area of chronic pressure ulcer to the left heel without evidence of erythema purulence or acute inflammatory or infectious process.  No joint effusions. Neurologic:  Normal speech and language. No gross focal neurologic deficits are appreciated. No facial droop Skin:  Skin is warm, dry and intact. No rash noted. Psychiatric: Mood and affect are normal. Speech and behavior are normal.  ____________________________________________   LABS (all labs ordered are listed, but only abnormal results are displayed)  Results for orders placed or performed during the hospital encounter of 04/23/19 (from the past 24 hour(s))  CBC with Differential/Platelet     Status: Abnormal   Collection Time: 04/23/19  3:10 PM  Result Value Ref Range   WBC 5.6 4.0 - 10.5 K/uL   RBC 2.68 (L) 3.87 - 5.11 MIL/uL   Hemoglobin 8.3 (L) 12.0 - 15.0 g/dL   HCT 26.2 (L) 36.0 - 46.0 %   MCV 97.8 80.0 - 100.0 fL   MCH  31.0 26.0 - 34.0 pg   MCHC 31.7 30.0 - 36.0 g/dL   RDW 15.9 (H) 11.5 - 15.5 %   Platelets 140 (L) 150 - 400 K/uL   nRBC 0.0 0.0 - 0.2 %   Neutrophils Relative % 66 %   Neutro Abs 3.7 1.7 - 7.7 K/uL   Lymphocytes Relative 25 %   Lymphs Abs 1.4 0.7 - 4.0 K/uL   Monocytes Relative 7 %   Monocytes Absolute 0.4 0.1 - 1.0 K/uL   Eosinophils Relative 1 %   Eosinophils Absolute 0.0 0.0 - 0.5 K/uL   Basophils Relative 1 %   Basophils Absolute 0.0 0.0 - 0.1 K/uL   Immature Granulocytes 0 %   Abs Immature Granulocytes 0.01 0.00 - 0.07 K/uL  Comprehensive metabolic panel     Status: Abnormal   Collection Time: 04/23/19  3:10 PM  Result Value Ref Range   Sodium 137 135 - 145 mmol/L   Potassium 3.7 3.5 - 5.1 mmol/L   Chloride 107 98 - 111 mmol/L   CO2 25 22 - 32 mmol/L   Glucose, Bld 122 (H) 70 - 99 mg/dL   BUN 9 8 - 23 mg/dL   Creatinine, Ser 0.89 0.44 - 1.00 mg/dL   Calcium 8.3 (L) 8.9 - 10.3 mg/dL   Total Protein 6.0 (L) 6.5 - 8.1 g/dL   Albumin 3.7 3.5 - 5.0 g/dL   AST 21 15 - 41 U/L   ALT 11 0 - 44 U/L   Alkaline Phosphatase 84 38 - 126 U/L   Total Bilirubin 0.4 0.3 - 1.2 mg/dL   GFR calc non Af Amer >60 >60 mL/min   GFR calc Af Amer >60 >60 mL/min   Anion gap 5 5 - 15  Urinalysis, Complete w Microscopic     Status: Abnormal   Collection Time: 04/23/19  3:17 PM  Result Value Ref Range   Color, Urine YELLOW (A) YELLOW   APPearance CLEAR (A) CLEAR   Specific Gravity, Urine 1.011 1.005 - 1.030   pH 6.0 5.0 - 8.0   Glucose, UA NEGATIVE NEGATIVE mg/dL   Hgb urine dipstick NEGATIVE NEGATIVE   Bilirubin Urine NEGATIVE NEGATIVE   Ketones, ur NEGATIVE NEGATIVE mg/dL   Protein, ur 30 (A) NEGATIVE mg/dL   Nitrite NEGATIVE NEGATIVE   Leukocytes,Ua NEGATIVE NEGATIVE   RBC / HPF 0-5 0 -  5 RBC/hpf   WBC, UA 0-5 0 - 5 WBC/hpf   Bacteria, UA RARE (A) NONE SEEN   Squamous Epithelial / LPF NONE SEEN 0 - 5   __________________________________________________   ____________________________________________   PROCEDURES  Procedure(s) performed:  Procedures    Critical Care performed: no ____________________________________________   INITIAL IMPRESSION / ASSESSMENT AND PLAN / ED COURSE  Pertinent labs & imaging results that were available during my care of the patient were reviewed by me and considered in my medical decision making (see chart for details).   DDX: wound, cellulitis, dehydration, electrolyte abn, sepsis  Diania Co is a 68 y.o. who presents to the ED with symptoms as described above.  She is afebrile hemodynamically stable.  Does not appear septic.  The area of the heel that she was sent for evaluation does not appear to be acutely infected.  Does have some evidence of chronic pressure ulcer changes.  Will place dressing.  Will check some basic blood work and reassess.  Clinical Course as of Apr 22 1752  Wed Apr 23, 2019  1749 Patient's work-up in the ER has been reassuring.  Noted small change in her hemoglobin from previous several weeks ago.  No sign of melena.  Guaiac is negative.  Likely anemia of chronic disease.  At this point do believe she is stable and appropriate for outpatient follow-up.   [PR]    Clinical Course User Index [PR] Merlyn Lot, MD    The patient was evaluated in Emergency Department today for the symptoms described in the history of present illness. He/she was evaluated in the context of the global COVID-19 pandemic, which necessitated consideration that the patient might be at risk for infection with the SARS-CoV-2 virus that causes COVID-19. Institutional protocols and algorithms that pertain to the evaluation of patients at risk for COVID-19 are in a state of rapid change based on information released by regulatory bodies including the CDC and federal and state organizations. These policies and algorithms were followed during the patient's care in the ED.  As part of my medical  decision making, I reviewed the following data within the [place] notes reviewed and incorporated, Labs reviewed, notes from prior ED visits and [place] Controlled Substance Database   ____________________________________________   FINAL CLINICAL IMPRESSION(S) / ED DIAGNOSES  Final diagnoses:  Generalized weakness  Visit for wound check      NEW MEDICATIONS STARTED DURING THIS VISIT:  New Prescriptions   No medications on file     Note:  This document was prepared using Dragon voice recognition software and may include unintentional dictation errors.    Merlyn Lot, MD 04/23/19 1753

## 2019-04-23 NOTE — ED Triage Notes (Signed)
Pt comes EMS from home after home health nurse told her temp was too low and wound on bottom of left heel appeared infected. Temp was 99.1 with EMS. Wound appears a little yellow per home health nurse.

## 2019-04-23 NOTE — ED Notes (Signed)
Pt states cannot urinate at this time. Will check back shortly.

## 2019-04-23 NOTE — ED Notes (Signed)
performed in and out cath with meuriel, Careers adviser. Pt tolerated well.

## 2019-04-23 NOTE — ED Notes (Signed)
Gave pt water to help pt void

## 2019-04-23 NOTE — ED Notes (Signed)
Got pt up to toilet in attempt to get urine sample, no success

## 2019-04-23 NOTE — Discharge Instructions (Addendum)
Please follow-up with PCP for repeat blood work and podiatry for wound check of the left foot.  We have applied pressure dressing but please try to keep legs elevated on soft cushion to prevent any additional skin breakdown.  I have sent a prescription for antibiotics to your pharmacy.  Return for additional questions or concerns.

## 2019-04-24 LAB — URINE CULTURE: Culture: NO GROWTH

## 2019-06-11 ENCOUNTER — Telehealth: Payer: Self-pay | Admitting: Adult Health Nurse Practitioner

## 2019-06-11 NOTE — Telephone Encounter (Signed)
Left VM stating reason for call and left contact info to schedule a visit. Roarke Marciano K. Olena Heckle NP

## 2019-07-07 ENCOUNTER — Other Ambulatory Visit: Payer: Self-pay

## 2019-07-07 ENCOUNTER — Encounter: Payer: Self-pay | Admitting: Medical Oncology

## 2019-07-07 ENCOUNTER — Emergency Department
Admission: EM | Admit: 2019-07-07 | Discharge: 2019-07-07 | Disposition: A | Discharge status: Home / Self Care | Payer: Medicare HMO | Attending: Emergency Medicine | Admitting: Emergency Medicine

## 2019-07-07 DIAGNOSIS — N3000 Acute cystitis without hematuria: Secondary | ICD-10-CM | POA: Insufficient documentation

## 2019-07-07 DIAGNOSIS — Z79899 Other long term (current) drug therapy: Secondary | ICD-10-CM | POA: Insufficient documentation

## 2019-07-07 DIAGNOSIS — I10 Essential (primary) hypertension: Secondary | ICD-10-CM | POA: Diagnosis not present

## 2019-07-07 DIAGNOSIS — Z85830 Personal history of malignant neoplasm of bone: Secondary | ICD-10-CM | POA: Diagnosis not present

## 2019-07-07 DIAGNOSIS — Z86718 Personal history of other venous thrombosis and embolism: Secondary | ICD-10-CM | POA: Diagnosis not present

## 2019-07-07 DIAGNOSIS — Z794 Long term (current) use of insulin: Secondary | ICD-10-CM | POA: Diagnosis not present

## 2019-07-07 DIAGNOSIS — Z7901 Long term (current) use of anticoagulants: Secondary | ICD-10-CM | POA: Diagnosis not present

## 2019-07-07 DIAGNOSIS — E1165 Type 2 diabetes mellitus with hyperglycemia: Secondary | ICD-10-CM | POA: Diagnosis present

## 2019-07-07 DIAGNOSIS — R739 Hyperglycemia, unspecified: Secondary | ICD-10-CM

## 2019-07-07 DIAGNOSIS — F17210 Nicotine dependence, cigarettes, uncomplicated: Secondary | ICD-10-CM | POA: Diagnosis not present

## 2019-07-07 DIAGNOSIS — Z8673 Personal history of transient ischemic attack (TIA), and cerebral infarction without residual deficits: Secondary | ICD-10-CM | POA: Diagnosis not present

## 2019-07-07 LAB — GLUCOSE, CAPILLARY: Glucose-Capillary: 282 mg/dL — ABNORMAL HIGH (ref 70–99)

## 2019-07-07 LAB — URINALYSIS, COMPLETE (UACMP) WITH MICROSCOPIC
Bilirubin Urine: NEGATIVE
Glucose, UA: NEGATIVE mg/dL
Hgb urine dipstick: NEGATIVE
Ketones, ur: NEGATIVE mg/dL
Nitrite: NEGATIVE
Protein, ur: 100 mg/dL — AB
Specific Gravity, Urine: 1.017 (ref 1.005–1.030)
WBC, UA: >50 WBC/hpf — ABNORMAL HIGH (ref 0–5)
pH: 6 (ref 5.0–8.0)

## 2019-07-07 LAB — CBC
HCT: 26.3 % — ABNORMAL LOW (ref 36.0–46.0)
Hemoglobin: 8.1 g/dL — ABNORMAL LOW (ref 12.0–15.0)
MCH: 32.4 pg (ref 26.0–34.0)
MCHC: 30.8 g/dL (ref 30.0–36.0)
MCV: 105.2 fL — ABNORMAL HIGH (ref 80.0–100.0)
Platelets: 144 10*3/uL — ABNORMAL LOW (ref 150–400)
RBC: 2.5 MIL/uL — ABNORMAL LOW (ref 3.87–5.11)
RDW: 13.6 % (ref 11.5–15.5)
WBC: 5.5 10*3/uL (ref 4.0–10.5)
nRBC: 0 % (ref 0.0–0.2)

## 2019-07-07 LAB — BASIC METABOLIC PANEL
Anion gap: 6 (ref 5–15)
BUN: 19 mg/dL (ref 8–23)
CO2: 25 mmol/L (ref 22–32)
Calcium: 8.5 mg/dL — ABNORMAL LOW (ref 8.9–10.3)
Chloride: 108 mmol/L (ref 98–111)
Creatinine, Ser: 0.96 mg/dL (ref 0.44–1.00)
GFR calc Af Amer: >60 mL/min (ref >60)
GFR calc non Af Amer: >60 mL/min (ref >60)
Glucose, Bld: 272 mg/dL — ABNORMAL HIGH (ref 70–99)
Potassium: 3.6 mmol/L (ref 3.5–5.1)
Sodium: 139 mmol/L (ref 135–145)

## 2019-07-07 MED ORDER — CEPHALEXIN 500 MG PO CAPS: 500.0000 mg | ORAL_CAPSULE | Freq: Two times a day (BID) | ORAL | 0 refills | Status: AC

## 2019-07-07 MED ORDER — CEPHALEXIN 500 MG PO CAPS: 500.0000 mg | ORAL_CAPSULE | Freq: Two times a day (BID) | ORAL | 0 refills | Status: DC

## 2019-07-07 MED ORDER — CEPHALEXIN 500 MG PO CAPS
500.0000 mg | ORAL_CAPSULE | Freq: Once | ORAL | Status: AC
  Administered 2019-07-07: 06:00:00 500 mg via ORAL
  Filled 2019-07-07: qty 1

## 2019-07-07 MED ORDER — SODIUM CHLORIDE 0.9 % IV BOLUS
1000.0000 mL | Freq: Once | INTRAVENOUS | Status: AC
  Administered 2019-07-07: 02:00:00 1000 mL via INTRAVENOUS

## 2019-07-07 NOTE — ED Notes (Signed)
First Nurse Note: Pt from home to [facility] via ems reports of elevated CBG of 397 with ems. 153/83 100RA 98.6.

## 2019-07-07 NOTE — ED Notes (Addendum)
Patient reports high sugar at home to 475 and frequent urination. Patient denies nausea/vomiting/diarrhea. Denies being out of insulin and has been taking it as she is supposed to. Denies any steroid use currently. Patient denies lower back pain or lower abdominal pain but states a little pain in the lower flank area. Patient admits to urgency and frequency but denies dysuria.

## 2019-07-07 NOTE — ED Notes (Signed)
Lab called to re-collect blood work.

## 2019-07-07 NOTE — ED Triage Notes (Signed)
Pt from home via ems with reports that pts daughter has noticed today that her blood sugar has been higher than normal. With ems CBG was 387. Highest at home 475. Pt reports polyuria. Denies pain.

## 2019-07-07 NOTE — ED Provider Notes (Signed)
[facility] Emergency Department Provider Note   First MD Initiated Contact with Patient 07/07/19 0507     (approximate)  I have reviewed the triage vital signs and the nursing notes.   HISTORY  Chief Complaint Hyperglycemia   HPI Tasha Howard is a 68 y.o. female with below listed previous medical conditions including diabetes mellitus presents to the emergency department secondary to elevated glucose at home.  Patient states highest glucose reading has been 475.  Patient also admits to polyuria and increased urinary frequency however patient denies any dysuria.  Patient denies any fever no nausea vomiting diarrhea.  Patient denies any cough or shortness of breath.  Patient denies any chest pain.       Past Medical History:  Diagnosis Date  . Bone cancer ([place])   . Cancer ([place])   . Diabetic neuropathy ([place])   . DM (diabetes mellitus) ([place])   . DVT (deep vein thrombosis) in pregnancy   . Hypertension   . Stroke [facility])     Patient Active Problem List   Diagnosis Date Noted  . History of CVA (cerebrovascular accident)   . Left hemiplegia ([place])   . Advanced care planning/counseling discussion   . Goals of care, counseling/discussion   . Palliative care by specialist   . Pressure injury of skin 10/31/2018  . Protein-calorie malnutrition, severe 10/31/2018  . Sepsis ([place]) 10/30/2018    Past Surgical History:  Procedure Laterality Date  . ABDOMINAL HYSTERECTOMY      Prior to Admission medications   Medication Sig Start Date End Date Taking? Authorizing Provider  acetaminophen (TYLENOL) 325 MG tablet Take 650 mg by mouth every 6 (six) hours as needed for pain. 09/25/18   [provider]  albuterol (PROVENTIL HFA;VENTOLIN HFA) 108 (90 Base) MCG/ACT inhaler Inhale 2 puffs into the lungs every 6 (six) hours as needed for wheezing. 12/28/15   [provider]  apixaban (ELIQUIS) 2.5 MG TABS tablet Take 2.5 mg by mouth 2 (two)  times daily. 09/25/18   [provider]  atorvastatin (LIPITOR) 80 MG tablet Take 1 tablet (80 mg total) by mouth every evening for 30 days. 11/02/18 12/02/18  Salary, Avel Peace, MD  butalbital-acetaminophen-caffeine (FIORICET, ESGIC) [phone] MG tablet Take 1 tablet by mouth every 6 (six) hours as needed for headache. 09/25/18   [provider]  cetirizine (ZYRTEC) 10 MG tablet Take 10 mg by mouth daily. 03/28/17   [provider]  collagenase (SANTYL) ointment Apply topically daily. To affected area 11/03/18   Salary, Avel Peace, MD  dicyclomine (BENTYL) 20 MG tablet Take 1 tablet (20 mg total) by mouth every 6 (six) hours as needed. 01/17/19   Paulette Blanch, MD  DULoxetine (CYMBALTA) 60 MG capsule Take 60 mg by mouth daily. 09/26/18 11/15/18  [provider]  feeding supplement, ENSURE ENLIVE, (ENSURE ENLIVE) LIQD Take 237 mLs by mouth 2 (two) times daily between meals. 11/02/18   Salary, Avel Peace, MD  furosemide (LASIX) 20 MG tablet Take 20 mg by mouth daily. 04/17/18   [provider]  guaiFENesin (ROBITUSSIN) 100 MG/5ML SOLN Take 5 mLs (100 mg total) by mouth every 4 (four) hours as needed for cough or to loosen phlegm. 11/02/18   Salary, Holly Bodily D, MD  insulin glargine (LANTUS) 100 UNIT/ML injection Inject 6 Units into the skin Nightly. 09/25/18 10/30/18  [provider]  insulin lispro (HUMALOG) 100 UNIT/ML injection Inject 10 Units into the skin 3 (three) times daily  with meals. Has been taking 3 units at meal times    [provider]  losartan (COZAAR) 25 MG tablet Take 12.5 mg by mouth daily. 09/26/18 10/30/18  [provider]  Melatonin 3 MG TABS Take 6 mg by mouth every evening. 09/25/18   [provider]  metoprolol tartrate (LOPRESSOR) 25 MG tablet Take 0.5 tablets (12.5 mg total) by mouth 2 (two) times daily. 11/02/18   Salary, Avel Peace, MD  mirtazapine (REMERON) 15 MG tablet Take 15 mg by mouth at bedtime.     [provider]  Multiple Minerals (MULTI-MINERALS PO) Take 1 tablet by mouth daily. 09/26/18   [provider]  nicotine (NICODERM CQ - DOSED IN MG/24 HOURS) 14 mg/24hr patch Place 1 patch (14 mg total) onto the skin daily. Patient not taking: Reported on 11/15/2018 11/03/18   Salary, Holly Bodily D, MD  nitroGLYCERIN (NITROLINGUAL) 0.4 MG/SPRAY spray Place 0.4 mg under the tongue every 5 (five) minutes x 3 doses as needed for chest pain.  07/12/18 07/12/19  [provider]  ondansetron (ZOFRAN) 4 MG tablet Take 1 tablet (4 mg total) by mouth every 8 (eight) hours as needed. 03/26/19   Nance Pear, MD  ondansetron (ZOFRAN-ODT) 4 MG disintegrating tablet Take 1 tablet (4 mg total) by mouth every 8 (eight) hours as needed for nausea or vomiting. 02/28/19   Triplett, Cari B, FNP  oxyCODONE-acetaminophen (PERCOCET) 7.5-325 MG tablet Take 1 tablet by mouth every 8 (eight) hours as needed for pain. 08/30/18   [provider]  potassium chloride SA (KLOR-CON M20) 20 MEQ tablet Take 1 tablet (20 mEq total) by mouth daily. 11/19/18   Hinda Kehr, MD  pregabalin (LYRICA) 75 MG capsule Take 75 mg by mouth 2 (two) times daily. 08/30/18 11/28/18  [provider]  thiamine 100 MG tablet Take 200 mg by mouth 2 (two) times daily. 09/25/18 09/25/19  [provider]  valACYclovir (VALTREX) 500 MG tablet Take 500 mg by mouth daily. 06/03/18   [provider]  vitamin C (ASCORBIC ACID) 500 MG tablet Take 500 mg by mouth 2 (two) times daily.    [provider]  zinc sulfate 220 (50 Zn) MG capsule Take 220 mg by mouth daily.    [provider]    Allergies Metformin and related  Family History  Problem Relation Age of Onset  . Cancer Mother   . Diabetes Mother   . Heart disease Mother   . Cancer Father     Social History Social History   Tobacco Use  . Smoking status: Current Every Day Smoker    Packs/day: 0.75    Types: Cigarettes  . Smokeless  tobacco: Never Used  Substance Use Topics  . Alcohol use: Not Currently  . Drug use: Not Currently    Review of Systems Constitutional: No fever/chills Eyes: No visual changes. ENT: No sore throat. Cardiovascular: Denies chest pain. Respiratory: Denies shortness of breath. Gastrointestinal: No abdominal pain.  No nausea, no vomiting.  No diarrhea.  No constipation. Genitourinary: Negative for dysuria.  Positive for urinary frequency and urgency Musculoskeletal: Negative for neck pain.  Negative for back pain. Integumentary: Negative for rash. Neurological: Negative for headaches, focal weakness or numbness.   ____________________________________________   PHYSICAL EXAM:  VITAL SIGNS: ED Triage Vitals  Enc Vitals Group     BP 07/07/19 0148 132/70     Pulse Rate 07/07/19 0148 73     Resp 07/07/19 0148 17  Temp 07/07/19 0148 98.5 F (36.9 C)     Temp Source 07/07/19 0148 Oral     SpO2 07/07/19 0148 98 %     Weight 07/07/19 0150 43.1 kg (95 lb)     Height 07/07/19 0150 1.676 m (5\' 6" )     Head Circumference --      Peak Flow --      Pain Score 07/07/19 0149 0     Pain Loc --      Pain Edu? --      Excl. in [place]? --     Constitutional: Alert and oriented.  Eyes: Conjunctivae are normal.  Mouth/Throat: Patient is wearing a mask. Neck: No stridor.  No meningeal signs.   Cardiovascular: Normal rate, regular rhythm. Good peripheral circulation. Grossly normal heart sounds. Respiratory: Normal respiratory effort.  No retractions. Gastrointestinal: Soft and nontender. No distention.  Musculoskeletal: No lower extremity tenderness nor edema. No gross deformities of extremities. Neurologic:  Normal speech and language. No gross focal neurologic deficits are appreciated.  Skin:  Skin is warm, dry and intact. Psychiatric: Mood and affect are normal. Speech and behavior are normal.  ____________________________________________   LABS (all labs ordered are listed, but only  abnormal results are displayed)  Labs Reviewed  URINALYSIS, COMPLETE (UACMP) WITH MICROSCOPIC - Abnormal; Notable for the following components:      Result Value   Color, Urine YELLOW (*)    APPearance CLOUDY (*)    Protein, ur 100 (*)    Leukocytes,Ua MODERATE (*)    WBC, UA >50 (*)    Bacteria, UA FEW (*)    Non Squamous Epithelial PRESENT (*)    All other components within normal limits  GLUCOSE, CAPILLARY - Abnormal; Notable for the following components:   Glucose-Capillary 282 (*)    All other components within normal limits  BASIC METABOLIC PANEL - Abnormal; Notable for the following components:   Glucose, Bld 272 (*)    Calcium 8.5 (*)    All other components within normal limits  CBC - Abnormal; Notable for the following components:   RBC 2.50 (*)    Hemoglobin 8.1 (*)    HCT 26.3 (*)    MCV 105.2 (*)    Platelets 144 (*)    All other components within normal limits  URINE CULTURE  CBG MONITORING, ED    Procedures   ____________________________________________   INITIAL IMPRESSION / MDM / [place] / ED COURSE  As part of my medical decision making, I reviewed the following data within the electronic MEDICAL RECORD NUMBER  68 year old female presented with above-stated history and physical exam secondary to hyperglycemia.  Consider possibility of infectious etiology causing hyperglycemia.  Urinalysis consistent with a UTI urine culture will be obtained as well.  Patient given Keflex in the emergency department will be prescribed the same for home.  Patient also given 1 L IV normal saline  ____________________________________________  FINAL CLINICAL IMPRESSION(S) / ED DIAGNOSES  Final diagnoses:  Acute cystitis without hematuria  Hyperglycemia     MEDICATIONS GIVEN DURING THIS VISIT:  Medications  cephALEXin (KEFLEX) capsule 500 mg (has no administration in time range)  sodium chloride 0.9 % bolus 1,000 mL (0 mLs Intravenous Stopped 07/07/19 0516)      ED Discharge Orders    None      *Please note:  Stephannie Li was evaluated in Emergency Department on 07/07/2019 for the symptoms described in the history of present illness. She was evaluated in  the context of the global COVID-19 pandemic, which necessitated consideration that the patient might be at risk for infection with the SARS-CoV-2 virus that causes COVID-19. Institutional protocols and algorithms that pertain to the evaluation of patients at risk for COVID-19 are in a state of rapid change based on information released by regulatory bodies including the CDC and federal and state organizations. These policies and algorithms were followed during the patient's care in the ED.  Some ED evaluations and interventions may be delayed as a result of limited staffing during the pandemic.*  Note:  This document was prepared using Dragon voice recognition software and may include unintentional dictation errors.   Gregor Hams, MD 07/07/19 [phone]

## 2019-07-09 LAB — URINE CULTURE: Culture: >=100000 — AB

## 2019-07-10 NOTE — Progress Notes (Signed)
Brief Pharmacy Note  Patient is a 68 y/o F who presented to [facility] ED on 11/2 c/o hyperglycemia. UA c/f UTI and patient was discharged on cephalexin. Urine culture from ED visit has resulted >100k colonies/mL K pneumoniae (cefazolin sensitive). Considering patient is appropriately covered on discharge antibiotics, will not pursue change to antibiotic regimen.   [place] Resident 10 July 2019

## 2019-08-22 ENCOUNTER — Telehealth: Payer: Self-pay | Admitting: Adult Health Nurse Practitioner

## 2019-08-22 NOTE — Telephone Encounter (Signed)
Spoke with daughter, [name]. Patient has moved out of the area.  Will have discharged from our services. Tesslyn Baumert K. Olena Heckle NP

## 2019-10-29 IMAGING — DX ABDOMEN - 2 VIEW
3 series · 3 of 3 positions shown · non-contrast
Comparison: None.

CLINICAL DATA: Generalized weakness. Diarrhea for 2-3 days. Left
side abdominal pain.

EXAM:
ABDOMEN - 2 VIEW

[abdomen erect]
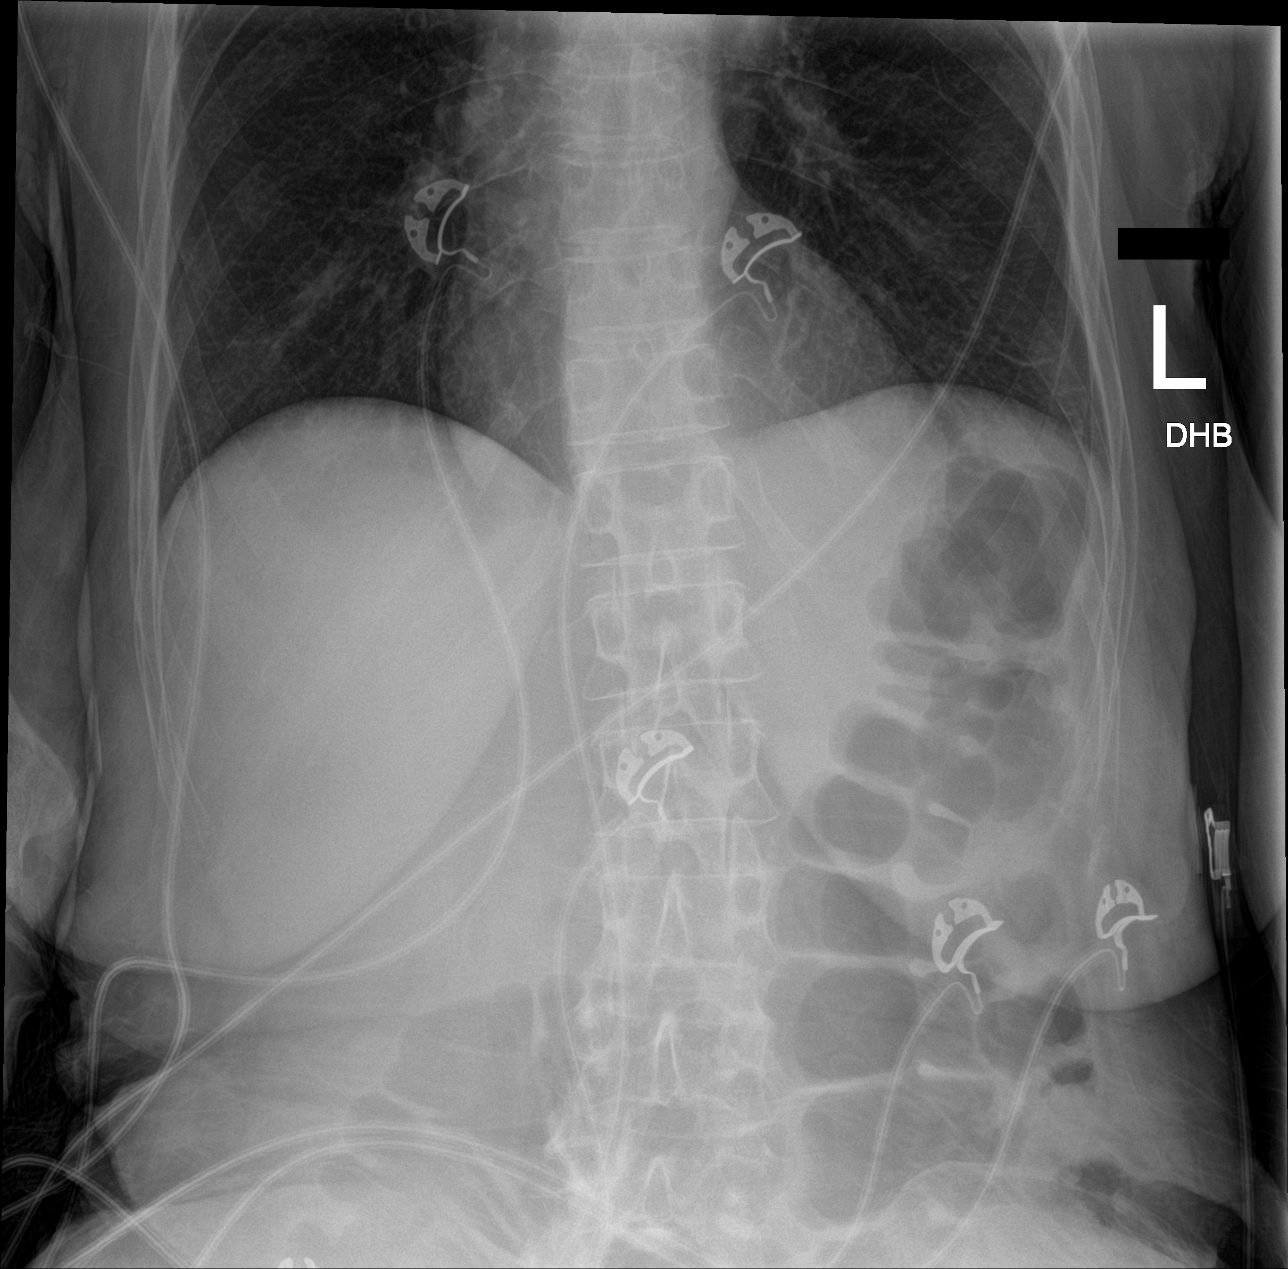

[abdomen supine (1 of 2)]
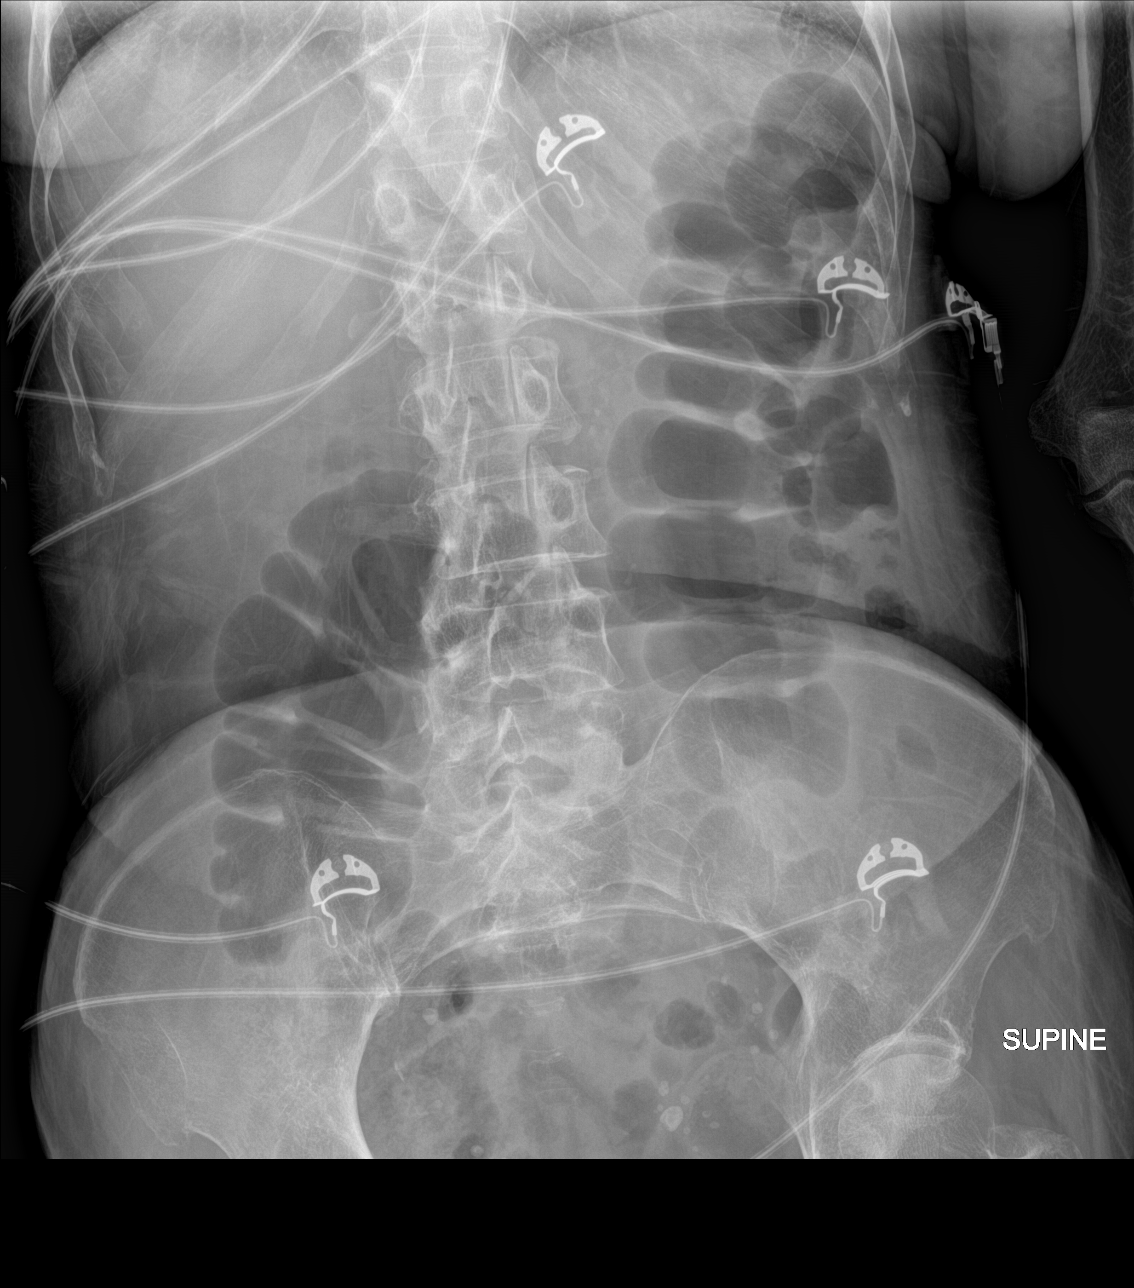

[abdomen supine (2 of 2)]
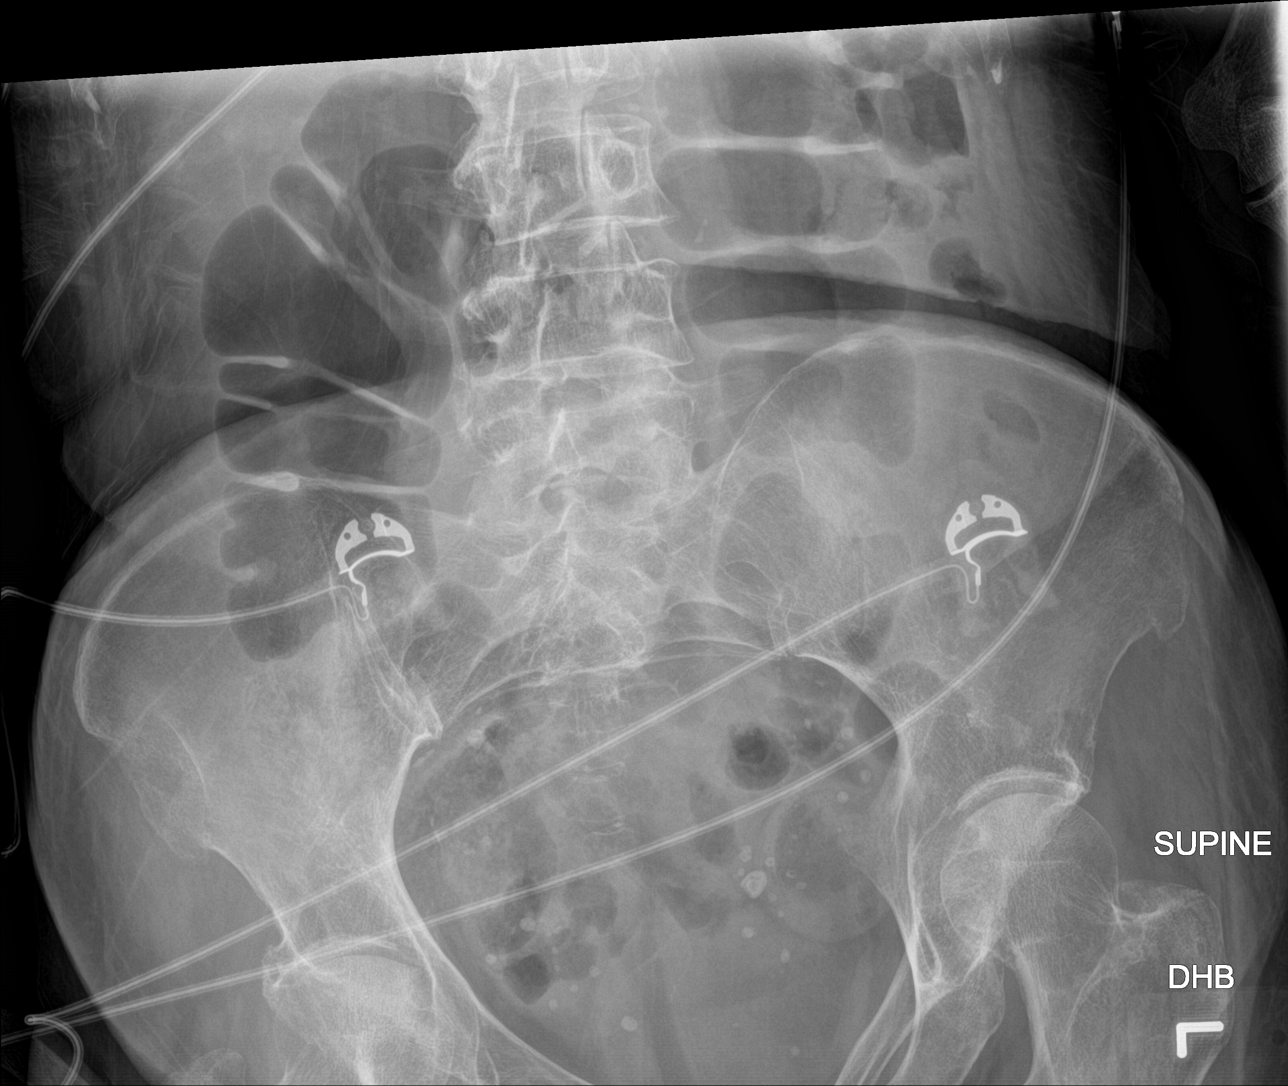

[3 of 3 positions shown; findings below may reference images not displayed]

FINDINGS: The bowel gas pattern is nonobstructive. There is mild gaseous
distention of the colon which is incidentally noted. There is no
evidence of free air. No radio-opaque calculi or other significant
radiographic abnormality is seen.
IMPRESSION: No acute abnormality.  Mild gaseous distention of the colon noted.
